# Patient Record
Sex: Male | Born: 1940 | Race: White | Hispanic: No | Smoking: Former smoker
Health system: Southern US, Community
[De-identification: ages and names within clinical notes are randomized; demographics above are authoritative.]

## PROBLEM LIST (undated history)

## (undated) DIAGNOSIS — E079 Disorder of thyroid, unspecified: Secondary | ICD-10-CM

## (undated) DIAGNOSIS — E785 Hyperlipidemia, unspecified: Secondary | ICD-10-CM

## (undated) DIAGNOSIS — I10 Essential (primary) hypertension: Secondary | ICD-10-CM

## (undated) DIAGNOSIS — J45909 Unspecified asthma, uncomplicated: Secondary | ICD-10-CM

## (undated) HISTORY — DX: Disorder of thyroid, unspecified: E07.9

## (undated) HISTORY — PX: THYROIDECTOMY: SHX17

## (undated) HISTORY — DX: Hyperlipidemia, unspecified: E78.5

## (undated) HISTORY — DX: Unspecified asthma, uncomplicated: J45.909

## (undated) HISTORY — DX: Essential (primary) hypertension: I10

---
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
## 2003-08-15 ENCOUNTER — Ambulatory Visit (HOSPITAL_COMMUNITY): Admission: RE | Admit: 2003-08-15 | Discharge: 2003-08-15 | Payer: Self-pay | Admitting: Internal Medicine

## 2008-04-13 ENCOUNTER — Encounter: Admission: RE | Admit: 2008-04-13 | Discharge: 2008-04-13 | Payer: Self-pay | Admitting: Family Medicine

## 2009-01-24 IMAGING — CT CT CHEST W/ CM
2 of 3 series · 15 of 36 positions shown, 18 images · IV contrast (75CC OMNI 300)
Comparison: No previous CT.

CLINICAL DATA: Abnormal chest x-ray.  Question pulmonary nodules

CT CHEST WITH CONTRAST
TECHNIQUE: Multidetector CT imaging of the chest was performed
following the standard protocol during bolus administration of
intravenous contrast.
Contrast: 75 ml 4mnipaque-MBB

[Series 3: routine chest · axial · 0.70mm/px · z∈[-310,+4]mm · 12 of 75 slices shown, 15 images]
[im 6/75  mediastinal]
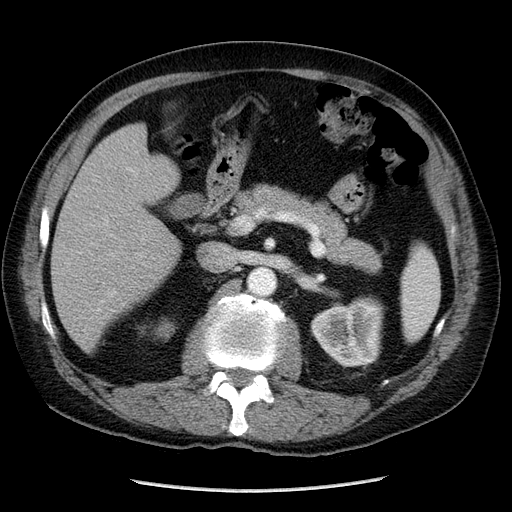
[im 6/75  lung]
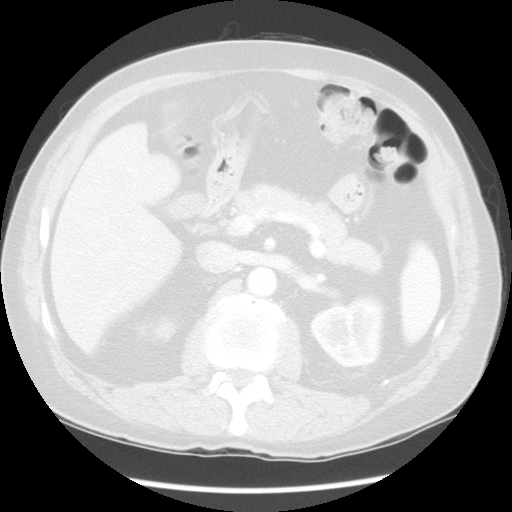
[im 11/75  lung]
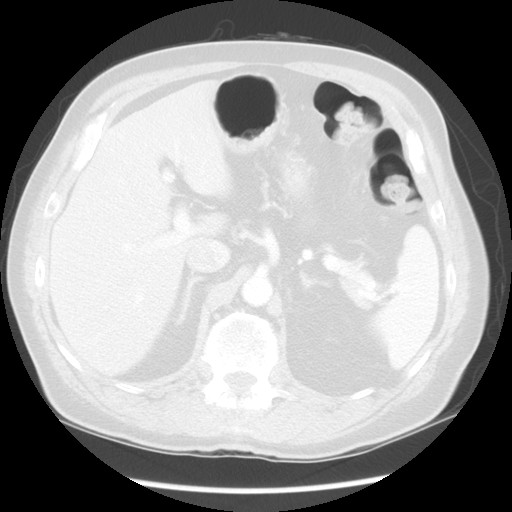
[im 17/75  lung]
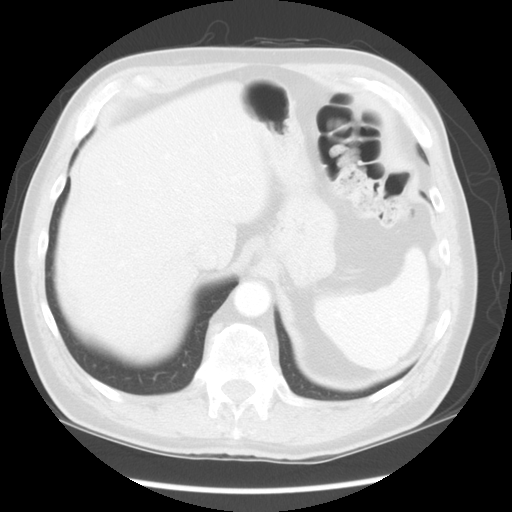
[im 22/75  lung]
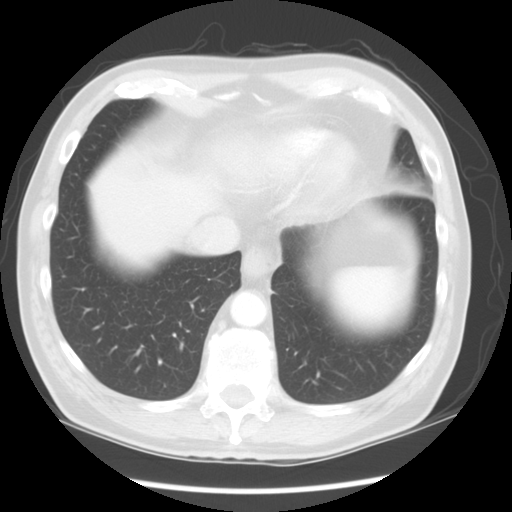
[im 28/75  mediastinal]
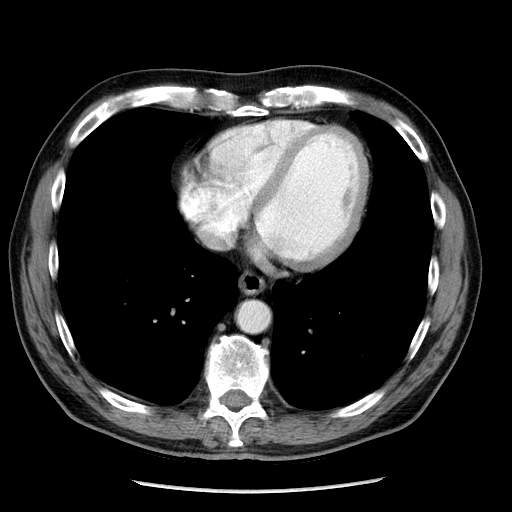
[im 28/75  lung]
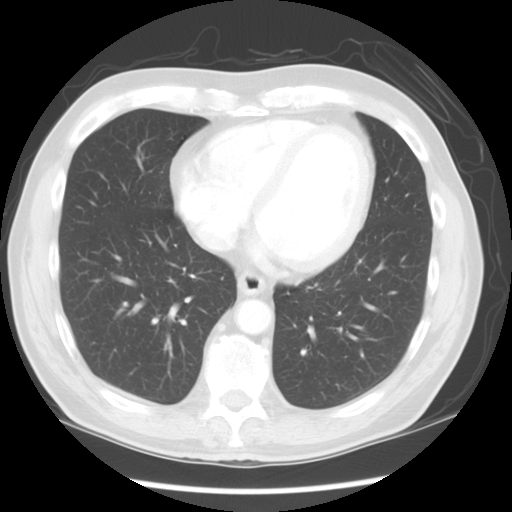
[im 33/75  lung]
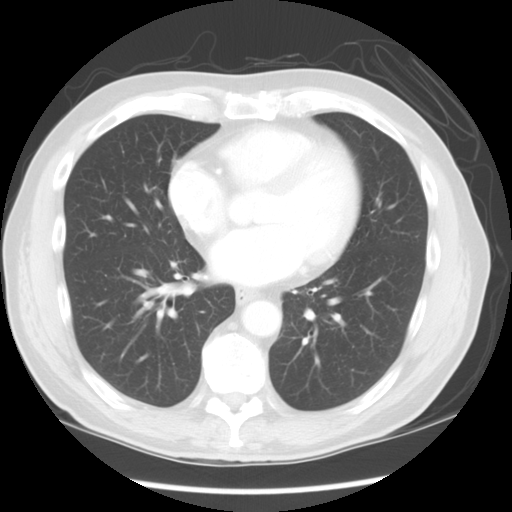
[im 42/75  lung]
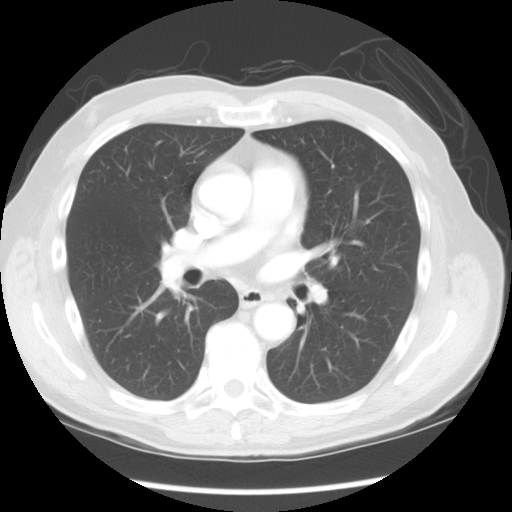
[im 47/75  lung]
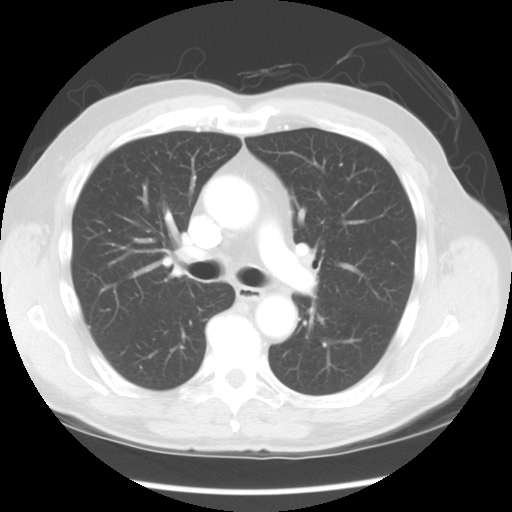
[im 53/75  mediastinal]
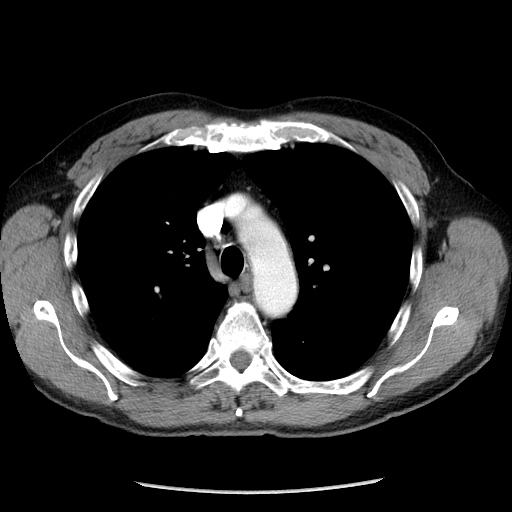
[im 53/75  lung]
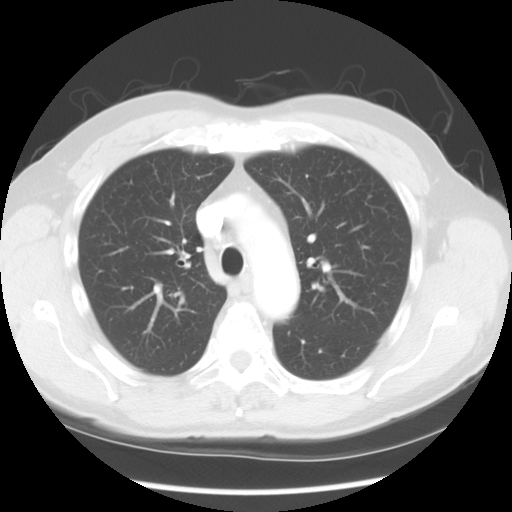
[im 58/75  lung]
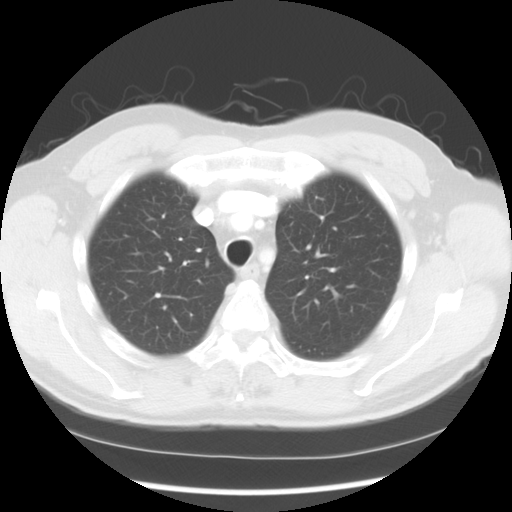
[im 64/75  lung]
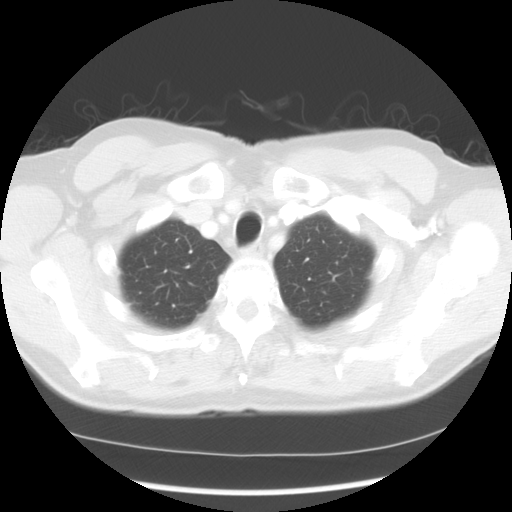
[im 69/75  lung]
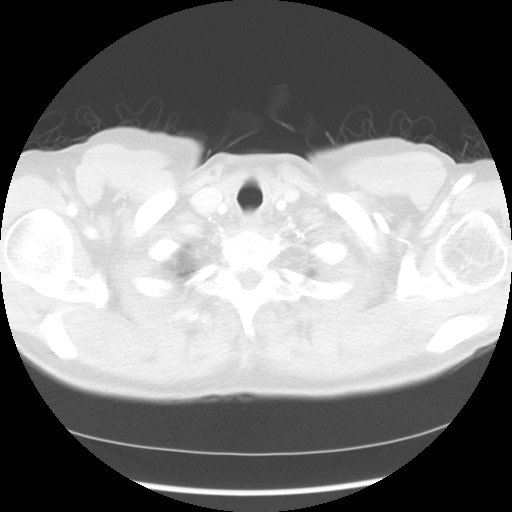

[Series 602: sagittal body · sagittal · 0.72mm/px · 3 of 145 slices shown]
[im 29/145  lung]
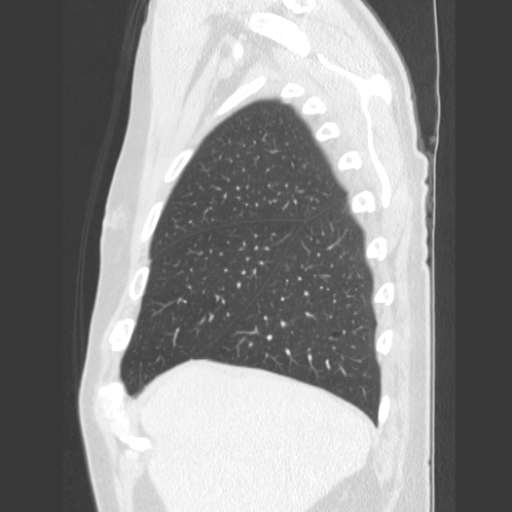
[im 58/145  lung]
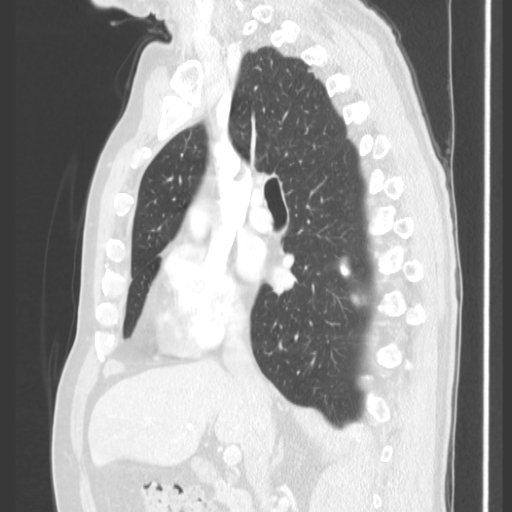
[im 87/145  lung]
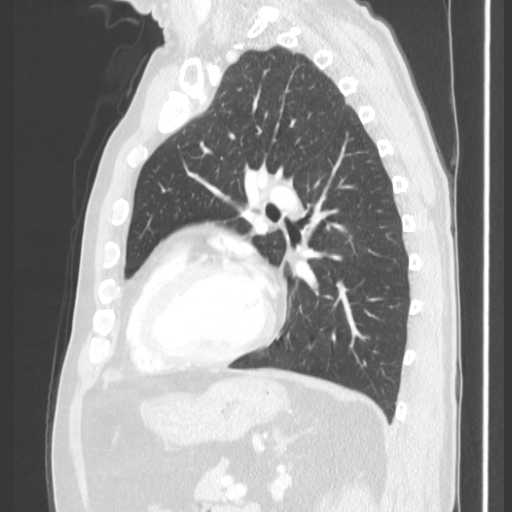

[15 of 36 positions shown; findings below may reference images not displayed]

Digitized films from [facility]
[REDACTED] are provided without a report.  The
lateral film has a circle drawn around a focal density superimposed
on the anterior aspect of a mid thoracic vertebral interspace,
suggesting spurring.
FINDINGS: Thyroid gland appears atrophic.  11 mm enhancing nodule
is seen in the expected location of the inferior right thyroid
gland and may represent a thyroid nodule.  There is no axillary or
mediastinal lymphadenopathy.  The heart size is normal.  No
pericardial or pleural effusion.

Lung windows show a calcified granuloma in the peripheral lingula.
No other parenchymal nodule or mass is evident.

The patient has a fairly prolific hypertrophic spurring from the
right anterolateral aspect of the thoracic vertebral bodies.  This
could account for the nodular opacities seen on the lateral chest x-
ray in question.
IMPRESSION: No abnormal pulmonary nodule at the area of concern circled on the
outside chest x-ray.  The findings on the chest x-ray are related
to spurring from the thoracic vertebral endplates anterolaterally.

11 mm enhancing nodule seen in the expected location of the thyroid
gland although the thyroid gland appears markedly atrophic.  I
cannot exclude that the patient has had a left thyroidectomy.
Given the round and avidly enhancing appearance of this nodule,
thyroid ultrasound is recommended to further evaluate and exclude
an underlying thyroid lesion on a background of thyroid atrophy.

## 2011-02-20 NOTE — Op Note (Signed)
NAME:  Samuel Norman, Samuel Norman                           ACCOUNT NO.:  [id number]   MEDICAL RECORD NO.:  [id number]                   PATIENT TYPE:  AMB   LOCATION:  DAY                                  FACILITY:  [facility]   PHYSICIAN:  R. Roetta Sessions, M.D.              DATE OF BIRTH:  1941/08/10   DATE OF PROCEDURE:  08/15/2003  DATE OF DISCHARGE:                                 OPERATIVE REPORT   PROCEDURE:  Screening colonoscopy.   INDICATIONS FOR PROCEDURE:  The patient is a 70 year old gentleman referred  by the courtesy of Dr. Gaynelle Cage for colorectal cancer screening.  He is  devoid of any lower GI tract symptoms.  No family history of colorectal  neoplasia.  He has never had his colon imaged.  Colonoscopy is now being  done as a standard screening maneuver.  This approach has been discussed  with the patient at length.  The potential risks, benefits, and alternatives  have been reviewed and questions answered.  He is agreeable.  Please see the  medical record for more information.   PROCEDURE:  O2 saturation, blood pressure, pulses, and respirations were  monitored throughout the entire procedure.  Conscious sedation was with  Versed 2 mg IV, Demerol 50 mg IV in divided doses.  The instrument used was  the Olympus video chip adult colonoscope.   FINDINGS:  Digital rectal examination revealed no abnormalities.   ENDOSCOPIC FINDINGS:  The prep was excellent.   Rectum:  Examination of the rectal mucosa including retroflex view of the  anal verge revealed no abnormalities.   Colon:  The colonic mucosa was surveyed from the rectosigmoid junction  through the left, transverse, right colon to the area of the appendiceal  orifice, ileocecal valve, and cecum.  It was a straight shot to the cecum.  The cecum was rapidly and easily reached.  The colonic mucosa to the cecum  appeared normal, except for some scattered sigmoid diverticula.  From the  level of the cecum and ileocecal valve, the  scope was slowly and cautiously  withdrawn.  All previously mentioned mucosal surfaces were again seen, and  again, no other abnormalities were observed.  The patient tolerated the  procedure well and was reactive in endoscopy.   IMPRESSION:  1. Normal rectum.  2. Scattered sigmoid diverticula.  3. The remainder of the colonic mucosa appeared normal.    RECOMMENDATIONS:  1. Diverticulosis literature provided to Mr. Franzel.  2. Repeat colonoscopy in 10 years.      ___________________________________________                                            Samuel Norman, M.D.   RMR/MEDQ  D:  08/15/2003  T:  08/15/2003  Job:  [id number]   cc:  Gaynelle Cage, MD  [phone]  [place]  [place] [address]  Fax: [phone]

## 2012-08-09 LAB — COMPLETE METABOLIC PANEL WITH GFR
PSA: 1.11
TSH: 2.975

## 2013-08-17 ENCOUNTER — Ambulatory Visit: Payer: Self-pay | Admitting: Family Medicine

## 2013-08-25 ENCOUNTER — Other Ambulatory Visit: Payer: Self-pay | Admitting: Physician Assistant

## 2013-08-28 MED ORDER — LEVOTHYROXINE SODIUM 100 MCG PO TABS: 100.0000 ug | ORAL_TABLET | Freq: Every day | ORAL | Status: DC

## 2013-08-28 NOTE — Telephone Encounter (Signed)
hasnt been seen since 08/09/12

## 2013-09-05 ENCOUNTER — Encounter: Payer: Self-pay | Admitting: Nurse Practitioner

## 2013-09-05 ENCOUNTER — Other Ambulatory Visit: Payer: Self-pay | Admitting: Nurse Practitioner

## 2013-09-05 ENCOUNTER — Ambulatory Visit (INDEPENDENT_AMBULATORY_CARE_PROVIDER_SITE_OTHER): Payer: Medicare Other | Admitting: Nurse Practitioner

## 2013-09-05 VITALS — BP 150/80 | HR 56 | Temp 98.0°F | Ht 69.18 in | Wt 169.8 lb

## 2013-09-05 DIAGNOSIS — E785 Hyperlipidemia, unspecified: Secondary | ICD-10-CM | POA: Insufficient documentation

## 2013-09-05 DIAGNOSIS — Z0000 Encounter for general adult medical examination without abnormal findings: Secondary | ICD-10-CM

## 2013-09-05 DIAGNOSIS — E039 Hypothyroidism, unspecified: Secondary | ICD-10-CM

## 2013-09-05 DIAGNOSIS — I10 Essential (primary) hypertension: Secondary | ICD-10-CM | POA: Insufficient documentation

## 2013-09-05 LAB — CBC
HCT: 44 % (ref 39.0–52.0)
MCH: 30.1 pg (ref 26.0–34.0)
MCHC: 34.1 g/dL (ref 30.0–36.0)
MCV: 88.4 fL (ref 78.0–100.0)
RDW: 13.5 % (ref 11.5–15.5)

## 2013-09-05 LAB — HEMOGLOBIN A1C: Mean Plasma Glucose: 108 mg/dL (ref <117)

## 2013-09-05 LAB — RENAL FUNCTION PANEL
Albumin: 4.5 g/dL (ref 3.5–5.2)
CO2: 28 mEq/L (ref 19–32)
Chloride: 105 mEq/L (ref 96–112)
Sodium: 141 mEq/L (ref 135–145)

## 2013-09-05 LAB — HEPATIC FUNCTION PANEL
AST: 16 U/L (ref 0–37)
Alkaline Phosphatase: 67 U/L (ref 39–117)
Bilirubin, Direct: 0.1 mg/dL (ref 0.0–0.3)
Total Bilirubin: 0.7 mg/dL (ref 0.3–1.2)

## 2013-09-05 LAB — LIPID PANEL: LDL Cholesterol: 143 mg/dL — ABNORMAL HIGH (ref 0–99)

## 2013-09-05 LAB — TSH: TSH: 2.91 u[IU]/mL (ref 0.350–4.500)

## 2013-09-05 MED ORDER — LEVOTHYROXINE SODIUM 100 MCG PO TABS: 100.0000 ug | ORAL_TABLET | Freq: Every day | ORAL | Status: DC

## 2013-09-05 NOTE — Progress Notes (Signed)
Pre-visit discussion using our clinic review tool. No additional management support is needed unless otherwise documented below in the visit note.  

## 2013-09-05 NOTE — Patient Instructions (Signed)
Please get labs done. Our office will call you results. Continue meds as prescribed. If you continue to have neck tightness and/or arm numbness with exertion, please consider seeing cardiology again. I recommend an 81 mg aspirin daily for stroke prevention. Continue your good health habits, like exercise. Great to see you!  Preventive Care for Adults, Male A healthy lifestyle and preventive care can promote health and wellness. Preventive health guidelines for men include the following key practices:  A routine yearly physical is a good way to check with your caregiver about your health and preventative screening. It is a chance to share any concerns and updates on your health, and to receive a thorough exam.  Visit your dentist for a routine exam and preventative care every 6 months. Brush your teeth twice a day and floss once a day. Good oral hygiene prevents tooth decay and gum disease.  The frequency of eye exams is based on your age, health, family medical history, use of contact lenses, and other factors. Follow your caregiver's recommendations for frequency of eye exams.  Eat a healthy diet. Foods like vegetables, fruits, whole grains, low-fat dairy products, and lean protein foods contain the nutrients you need without too many calories. Decrease your intake of foods high in solid fats, added sugars, and salt. Eat the right amount of calories for you.Get information about a proper diet from your caregiver, if necessary.  Regular physical exercise is one of the most important things you can do for your health. Most adults should get at least 150 minutes of moderate-intensity exercise (any activity that increases your heart rate and causes you to sweat) each week. In addition, most adults need muscle-strengthening exercises on 2 or more days a week.  Maintain a healthy weight. The body mass index (BMI) is a screening tool to identify possible weight problems. It provides an estimate of body fat  based on height and weight. Your caregiver can help determine your BMI, and can help you achieve or maintain a healthy weight.For adults 20 years and older:  A BMI below 18.5 is considered underweight.  A BMI of 18.5 to 24.9 is normal.  A BMI of 25 to 29.9 is considered overweight.  A BMI of 30 and above is considered obese.  Maintain normal blood lipids and cholesterol levels by exercising and minimizing your intake of saturated fat. Eat a balanced diet with plenty of fruit and vegetables. Blood tests for lipids and cholesterol should begin at age 17 and be repeated every 5 years. If your lipid or cholesterol levels are high, you are over 50, or you are a high risk for heart disease, you may need your cholesterol levels checked more frequently.Ongoing high lipid and cholesterol levels should be treated with medicines if diet and exercise are not effective.  If you smoke, find out from your caregiver how to quit. If you do not use tobacco, do not start.  Lung cancer screening is recommended for adults aged 45 80 years who are at high risk for developing lung cancer because of a history of smoking. Yearly low-dose computed tomography (CT) is recommended for people who have at least a 30-pack-year history of smoking and are a current smoker or have quit within the past 15 years. A pack year of smoking is smoking an average of 1 pack of cigarettes a day for 1 year (for example: 1 pack a day for 30 years or 2 packs a day for 15 years). Yearly screening should continue until the  smoker has stopped smoking for at least 15 years. Yearly screening should also be stopped for people who develop a health problem that would prevent them from having lung cancer treatment.  If you choose to drink alcohol, do not exceed 2 drinks per day. One drink is considered to be 12 ounces (355 mL) of beer, 5 ounces (148 mL) of wine, or 1.5 ounces (44 mL) of liquor.  Avoid use of street drugs. Do not share needles with  anyone. Ask for help if you need support or instructions about stopping the use of drugs.  High blood pressure causes heart disease and increases the risk of stroke. Your blood pressure should be checked at least every 1 to 2 years. Ongoing high blood pressure should be treated with medicines, if weight loss and exercise are not effective.  If you are 85 to 72 years old, ask your caregiver if you should take aspirin to prevent heart disease.  Diabetes screening involves taking a blood sample to check your fasting blood sugar level. This should be done once every 3 years, after age 57, if you are within normal weight and without risk factors for diabetes. Testing should be considered at a younger age or be carried out more frequently if you are overweight and have at least 1 risk factor for diabetes.  Colorectal cancer can be detected and often prevented. Most routine colorectal cancer screening begins at the age of 15 and continues through age 40. However, your caregiver may recommend screening at an earlier age if you have risk factors for colon cancer. On a yearly basis, your caregiver may provide home test kits to check for hidden blood in the stool. Use of a small camera at the end of a tube, to directly examine the colon (sigmoidoscopy or colonoscopy), can detect the earliest forms of colorectal cancer. Talk to your caregiver about this at age 24, when routine screening begins. Direct examination of the colon should be repeated every 5 to 10 years through age 30, unless early forms of pre-cancerous polyps or small growths are found.  Hepatitis C blood testing is recommended for all people born from [birth year] through 1965 and any individual with known risks for hepatitis C.  Practice safe sex. Use condoms and avoid high-risk sexual practices to reduce the spread of sexually transmitted infections (STIs). STIs include gonorrhea, chlamydia, syphilis, trichomonas, herpes, HPV, and human immunodeficiency  virus (HIV). Herpes, HIV, and HPV are viral illnesses that have no cure. They can result in disability, cancer, and death.  A one-time screening for abdominal aortic aneurysm (AAA) and surgical repair of large AAAs by sound wave imaging (ultrasonography) is recommended for ages 19 to 72 years who are current or former smokers.  Healthy men should no longer receive prostate-specific antigen (PSA) blood tests as part of routine cancer screening. Consult with your caregiver about prostate cancer screening.  Testicular cancer screening is not recommended for adult males who have no symptoms. Screening includes self-exam, caregiver exam, and other screening tests. Consult with your caregiver about any symptoms you have or any concerns you have about testicular cancer.  Use sunscreen. Apply sunscreen liberally and repeatedly throughout the day. You should seek shade when your shadow is shorter than you. Protect yourself by wearing long sleeves, pants, a wide-brimmed hat, and sunglasses year round, whenever you are outdoors.  Once a month, do a whole body skin exam, using a mirror to look at the skin on your back. Notify your caregiver of new moles,  moles that have irregular borders, moles that are larger than a pencil eraser, or moles that have changed in shape or color.  Stay current with required immunizations.  Influenza vaccine. All adults should be immunized every year.  Tetanus, diphtheria, and acellular pertussis (Td, Tdap) vaccine. An adult who has not previously received Tdap or who does not know his vaccine status should receive 1 dose of Tdap. This initial dose should be followed by tetanus and diphtheria toxoids (Td) booster doses every 10 years. Adults with an unknown or incomplete history of completing a 3-dose immunization series with Td-containing vaccines should begin or complete a primary immunization series including a Tdap dose. Adults should receive a Td booster every 10  years.  Varicella vaccine. An adult without evidence of immunity to varicella should receive 2 doses or a second dose if he has previously received 1 dose.  Human papillomavirus (HPV) vaccine. Males aged 46 21 years who have not received the vaccine previously should receive the 3-dose series. Males aged 13 26 years may be immunized. Immunization is recommended through the age of 39 years for any male who has sex with males and did not get any or all doses earlier. Immunization is recommended for any person with an immunocompromised condition through the age of 26 years if he did not get any or all doses earlier. During the 3-dose series, the second dose should be obtained 4 8 weeks after the first dose. The third dose should be obtained 24 weeks after the first dose and 16 weeks after the second dose.  Zoster vaccine. One dose is recommended for adults aged 65 years or older unless certain conditions are present.  Measles, mumps, and rubella (MMR) vaccine. Adults born before 65 generally are considered immune to measles and mumps. Adults born in [birth year] or later should have 1 or more doses of MMR vaccine unless there is a contraindication to the vaccine or there is laboratory evidence of immunity to each of the three diseases. A routine second dose of MMR vaccine should be obtained at least 28 days after the first dose for students attending postsecondary schools, health care workers, or international travelers. People who received inactivated measles vaccine or an unknown type of measles vaccine during 1963 1967 should receive 2 doses of MMR vaccine. People who received inactivated mumps vaccine or an unknown type of mumps vaccine before 1979 and are at high risk for mumps infection should consider immunization with 2 doses of MMR vaccine. Unvaccinated health care workers born before 45 who lack laboratory evidence of measles, mumps, or rubella immunity or laboratory confirmation of disease should  consider measles and mumps immunization with 2 doses of MMR vaccine or rubella immunization with 1 dose of MMR vaccine.  Pneumococcal 13-valent conjugate (PCV13) vaccine. When indicated, a person who is uncertain of his immunization history and has no record of immunization should receive the PCV13 vaccine. An adult aged 61 years or older who has certain medical conditions and has not been previously immunized should receive 1 dose of PCV13 vaccine. This PCV13 should be followed with a dose of pneumococcal polysaccharide (PPSV23) vaccine. The PPSV23 vaccine dose should be obtained at least 8 weeks after the dose of PCV13 vaccine. An adult aged 18 years or older who has certain medical conditions and previously received 1 or more doses of PPSV23 vaccine should receive 1 dose of PCV13. The PCV13 vaccine dose should be obtained 1 or more years after the last PPSV23 vaccine dose.  Pneumococcal  polysaccharide (PPSV23) vaccine. When PCV13 is also indicated, PCV13 should be obtained first. All adults aged 81 years and older should be immunized. An adult younger than age 57 years who has certain medical conditions should be immunized. Any person who resides in a nursing home or long-term care facility should be immunized. An adult smoker should be immunized. People with an immunocompromised condition and certain other conditions should receive both PCV13 and PPSV23 vaccines. People with human immunodeficiency virus (HIV) infection should be immunized as soon as possible after diagnosis. Immunization during chemotherapy or radiation therapy should be avoided. Routine use of PPSV23 vaccine is not recommended for American Indians, [address], or people younger than 65 years unless there are medical conditions that require PPSV23 vaccine. When indicated, people who have unknown immunization and have no record of immunization should receive PPSV23 vaccine. One-time revaccination 5 years after the first dose of PPSV23 is  recommended for people aged 24 64 years who have chronic kidney failure, nephrotic syndrome, asplenia, or immunocompromised conditions. People who received 1 2 doses of PPSV23 before age 31 years should receive another dose of PPSV23 vaccine at age 72 years or later if at least 5 years have passed since the previous dose. Doses of PPSV23 are not needed for people immunized with PPSV23 at or after age 75 years.  Meningococcal vaccine. Adults with asplenia or persistent complement component deficiencies should receive 2 doses of quadrivalent meningococcal conjugate (MenACWY-D) vaccine. The doses should be obtained at least 2 months apart. Microbiologists working with certain meningococcal bacteria, military recruits, people at risk during an outbreak, and people who travel to or live in countries with a high rate of meningitis should be immunized. A first-year college student up through age 81 years who is living in a residence hall should receive a dose if he did not receive a dose on or after his 16th birthday. Adults who have certain high-risk conditions should receive one or more doses of vaccine.  Hepatitis A vaccine. Adults who wish to be protected from this disease, have certain high-risk conditions, work with hepatitis A-infected animals, work in hepatitis A research labs, or travel to or work in countries with a high rate of hepatitis A should be immunized. Adults who were previously unvaccinated and who anticipate close contact with an international adoptee during the first 60 days after arrival in the [place] from a country with a high rate of hepatitis A should be immunized.  Hepatitis B vaccine. Adults who wish to be protected from this disease, have certain high-risk conditions, may be exposed to blood or other infectious body fluids, are household contacts or sex partners of hepatitis B positive people, are clients or workers in certain care facilities, or travel to or work in countries with  a high rate of hepatitis B should be immunized.  Haemophilus influenzae type b (Hib) vaccine. A previously unvaccinated person with asplenia or sickle cell disease or having a scheduled splenectomy should receive 1 dose of Hib vaccine. Regardless of previous immunization, a recipient of a hematopoietic stem cell transplant should receive a 3-dose series 6 12 months after his successful transplant. Hib vaccine is not recommended for adults with HIV infection. Preventive Service / Frequency Ages 60 to 29  Blood pressure check.** / Every 1 to 2 years.  Lipid and cholesterol check.** / Every 5 years beginning at age 54.  Hepatitis C blood test.** / For any individual with known risks for hepatitis C.  Skin self-exam. / Monthly.  Influenza vaccine. / Every year.  Tetanus, diphtheria, and acellular pertussis (Tdap, Td) vaccine.** / Consult your caregiver. 1 dose of Td every 10 years.  Varicella vaccine.** / Consult your caregiver.  HPV vaccine. / 3 doses over 6 months, if 26 and younger.  Measles, mumps, rubella (MMR) vaccine.** / You need at least 1 dose of MMR if you were born in 1957 or later. You may also need a 2nd dose.  Pneumococcal 13-valent conjugate (PCV13) vaccine.** / Consult your caregiver.  Pneumococcal polysaccharide (PPSV23) vaccine.** / 1 to 2 doses if you smoke cigarettes or if you have certain conditions.  Meningococcal vaccine.** / 1 dose if you are age 30 to 45 years and a Orthoptist living in a residence hall, or have one of several medical conditions, you need to get vaccinated against meningococcal disease. You may also need additional booster doses.  Hepatitis A vaccine.** / Consult your caregiver.  Hepatitis B vaccine.** / Consult your caregiver.  Haemophilus influenzae type b (Hib) vaccine.** / Consult your caregiver. Ages 39 to 46  Blood pressure check.** / Every 1 to 2 years.  Lipid and cholesterol check.** / Every 5 years beginning at age  47.  Lung cancer screening. / Every year if you are aged 73 80 years and have a 30-pack-year history of smoking and currently smoke or have quit within the past 15 years. Yearly screening is stopped once you have quit smoking for at least 15 years or develop a health problem that would prevent you from having lung cancer treatment.  Fecal occult blood test (FOBT) of stool. / Every year beginning at age 35 and continuing until age 35. You may not have to do this test if you get colonoscopy every 10 years.  Flexible sigmoidoscopy** or colonoscopy.** / Every 5 years for a flexible sigmoidoscopy or every 10 years for a colonoscopy beginning at age 14 and continuing until age 54.  Hepatitis C blood test.** / For all people born from [birth year] through 1965 and any individual with known risks for hepatitis C.  Skin self-exam. / Monthly.  Influenza vaccine. / Every year.  Tetanus, diphtheria, and acellular pertussis (Tdap/Td) vaccine.** / Consult your caregiver. 1 dose of Td every 10 years.  Varicella vaccine.** / Consult your caregiver.  Zoster vaccine.** / 1 dose for adults aged 9 years or older.  Measles, mumps, rubella (MMR) vaccine.** / You need at least 1 dose of MMR if you were born in 1957 or later. You may also need a 2nd dose.  Pneumococcal 13-valent conjugate (PCV13) vaccine.** / Consult your caregiver.  Pneumococcal polysaccharide (PPSV23) vaccine.** / 1 to 2 doses if you smoke cigarettes or if you have certain conditions.  Meningococcal vaccine.** / Consult your caregiver.  Hepatitis A vaccine.** / Consult your caregiver.  Hepatitis B vaccine.** / Consult your caregiver.  Haemophilus influenzae type b (Hib) vaccine.** / Consult your caregiver. Ages 70 and over  Blood pressure check.** / Every 1 to 2 years.  Lipid and cholesterol check.**/ Every 5 years beginning at age 41.  Lung cancer screening. / Every year if you are aged 57 80 years and have a 30-pack-year history of  smoking and currently smoke or have quit within the past 15 years. Yearly screening is stopped once you have quit smoking for at least 15 years or develop a health problem that would prevent you from having lung cancer treatment.  Fecal occult blood test (FOBT) of stool. / Every year beginning at age 61 and  continuing until age 90. You may not have to do this test if you get colonoscopy every 10 years.  Flexible sigmoidoscopy** or colonoscopy.** / Every 5 years for a flexible sigmoidoscopy or every 10 years for a colonoscopy beginning at age 53 and continuing until age 17.  Hepatitis C blood test.** / For all people born from [birth year] through 1966 and any individual with known risks for hepatitis C.  Abdominal aortic aneurysm (AAA) screening.** / A one-time screening for ages 31 to 98 years who are current or former smokers.  Skin self-exam. / Monthly.  Influenza vaccine. / Every year.  Tetanus, diphtheria, and acellular pertussis (Tdap/Td) vaccine.** / 1 dose of Td every 10 years.  Varicella vaccine.** / Consult your caregiver.  Zoster vaccine.** / 1 dose for adults aged 31 years or older.  Pneumococcal 13-valent conjugate (PCV13) vaccine.** / Consult your caregiver.  Pneumococcal polysaccharide (PPSV23) vaccine.** / 1 dose for all adults aged 26 years and older.  Meningococcal vaccine.** / Consult your caregiver.  Hepatitis A vaccine.** / Consult your caregiver.  Hepatitis B vaccine.** / Consult your caregiver.  Haemophilus influenzae type b (Hib) vaccine.** / Consult your caregiver. **Family history and personal history of risk and conditions may change your caregiver's recommendations. Document Released: 11/17/2001 Document Revised: 01/16/2013 Document Reviewed: 02/16/2011 [facility] Patient Information 2014 [place], [place].  Hypertension As your heart beats, it forces blood through your arteries. This force is your blood pressure. If the pressure is too high, it is called  hypertension (HTN) or high blood pressure. HTN is dangerous because you may have it and not know it. High blood pressure may mean that your heart has to work harder to pump blood. Your arteries may be narrow or stiff. The extra work puts you at risk for heart disease, stroke, and other problems.  Blood pressure consists of two numbers, a higher number over a lower, 110/72, for example. It is stated as "110 over 72." The ideal is below 120 for the top number (systolic) and under 80 for the bottom (diastolic). Write down your blood pressure today. You should pay close attention to your blood pressure if you have certain conditions such as:  Heart failure.  Prior heart attack.  Diabetes  Chronic kidney disease.  Prior stroke.  Multiple risk factors for heart disease. To see if you have HTN, your blood pressure should be measured while you are seated with your arm held at the level of the heart. It should be measured at least twice. A one-time elevated blood pressure reading (especially in the Emergency Department) does not mean that you need treatment. There may be conditions in which the blood pressure is different between your right and left arms. It is important to see your caregiver soon for a recheck. Most people have essential hypertension which means that there is not a specific cause. This type of high blood pressure may be lowered by changing lifestyle factors such as:  Stress.  Smoking.  Lack of exercise.  Excessive weight.  Drug/tobacco/alcohol use.  Eating less salt. Most people do not have symptoms from high blood pressure until it has caused damage to the body. Effective treatment can often prevent, delay or reduce that damage. TREATMENT  When a cause has been identified, treatment for high blood pressure is directed at the cause. There are a large number of medications to treat HTN. These fall into several categories, and your caregiver will help you select the medicines  that are best for you. Medications may  have side effects. You should review side effects with your caregiver. If your blood pressure stays high after you have made lifestyle changes or started on medicines,   Your medication(s) may need to be changed.  Other problems may need to be addressed.  Be certain you understand your prescriptions, and know how and when to take your medicine.  Be sure to follow up with your caregiver within the time frame advised (usually within two weeks) to have your blood pressure rechecked and to review your medications.  If you are taking more than one medicine to lower your blood pressure, make sure you know how and at what times they should be taken. Taking two medicines at the same time can result in blood pressure that is too low. SEEK IMMEDIATE MEDICAL CARE IF:  You develop a severe headache, blurred or changing vision, or confusion.  You have unusual weakness or numbness, or a faint feeling.  You have severe chest or abdominal pain, vomiting, or breathing problems. MAKE SURE YOU:   Understand these instructions.  Will watch your condition.  Will get help right away if you are not doing well or get worse. Document Released: 09/21/2005 Document Revised: 12/14/2011 Document Reviewed: 05/11/2008 [facility] Patient Information 2014 [place], [place].

## 2013-09-05 NOTE — Progress Notes (Signed)
Subjective:     Samuel Norman is a 72 y.o. male and is here to establish care. He is currently treated for HTN, dyslipidemia, and thyroid disease. Pt does not take all meds every day-he alternates days. He is changing providers due to convenience of location. He mentions that he has some neck tightness & R arm numbness when he is jogging up hill. He had a stress test & cardiac cath a few years ago that did not indicate he has heart disease. We discussed his risk factors: age, gender, HTN, & dyslipidemia.  History   Social History  . Marital Status: Married    Spouse Name: Renee    Number of Children: 2  . Years of Education: N/A   Occupational History  . retired     [employer], broadcasting/film/video   Social History Main Topics  . Smoking status: Former Smoker    Types: Cigarettes    Quit date: 09/06/1983  . Smokeless tobacco: Never Used  . Alcohol Use: No  . Drug Use: No  . Sexual Activity: Not on file   Other Topics Concern  . Not on file   Social History Narrative   Samuel Norman lives in [place] with his wife & son-in-law. He is a retired Engineer, site.   Health Maintenance  Topic Date Due  . Colonoscopy  09/05/2013  . Influenza Vaccine  09/05/2014  . Pneumococcal Polysaccharide Vaccine Age 41 And Over  09/05/2014  . Zostavax  09/05/2014  . Tetanus/tdap  09/05/2014  . Colon Cancer Screening Annual Fobt  09/05/2014    The following portions of the patient's history were reviewed and updated as appropriate: allergies, current medications, past family history, past medical history, past social history, past surgical history and problem list.  Review of Systems Constitutional: negative for chills, fatigue, fevers, night sweats and weight loss Eyes: positive for contacts/glasses Ears, nose, mouth, throat, and face: positive for partial plate Respiratory: negative for cough, sputum and wheezing Cardiovascular: positive for exertional chest pressure/discomfort, negative for irregular heart beat,  lower extremity edema and near-syncope Genitourinary:negative for decreased stream and urinary incontinence Integument/breast: positive for treated for eczema in past Behavioral/Psych: negative for excessive alcohol consumption, illegal drug usage, sleep disturbance and tobacco use Endocrine: negative for temperature intolerance Allergic/Immunologic: negative for hay fever   Objective:    BP 150/80  Pulse 56  Temp(Src) 98 F (36.7 C) (Oral)  Ht 5' 9.18" (1.757 m)  Wt 169 lb 12 oz (76.998 kg)  BMI 24.94 kg/m2  SpO2 99% General appearance: alert, cooperative, appears stated age and no distress Head: Normocephalic, without obvious abnormality, atraumatic Eyes: negative findings: lids and lashes normal, conjunctivae and sclerae normal, corneas clear and pupils equal, round, reactive to light and accomodation, positive findings: wears glasses Ears: normal TM's and external ear canals both ears Throat: lips, mucosa, and tongue normal; teeth and gums normal and upper & lower partial Lungs: clear to auscultation bilaterally Heart: reg rhythm, bradycardia Extremities: extremities normal, atraumatic, no cyanosis or edema and no edema, redness or tenderness in the calves or thighs Pulses: 2+ and symmetric Skin: brown seborrheic keratosis, some color variation, pt states has not changed in years. L side neck Few bruises L forearm-pt states he hit tree while clearing lot Lymph nodes: Cervical, supraclavicular, and axillary nodes normal. Neurologic: Alert and oriented X 3, normal strength and tone. Normal symmetric reflexes. 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 Denies symptoms of temp intolerance, bowel changes, heart palpitations. 4 seborrheic keratosis L side neck 5  neck tightness & r arm numbness w/exertion, prev cardiac w/ nml per pt 6 Prev care due for colo scr, tdap, pneumococcal, flu, shingles     Plan:    1 continue current dose to keep BP under 150/90. Ren & hep func, urine microalb today 2 Continue meds. Labs today 3 cont meds, labs today 4 monitor for changes 5 rec cardiac ref, pt declined, recmd scale back exercise intensity. Discussed risk factors for MI, CVD, stroke. recmd ASA daily. 6 declined all vaccines-does not want exposure to mercury. Advised no mercury in preservative free flu & boostrix. Pt will consider. Gave reading materials. ifob today. F/u 1 year or sooner if needed. See After Visit Summary for Counseling Recommendations

## 2013-09-06 LAB — VITAMIN D 25 HYDROXY (VIT D DEFICIENCY, FRACTURES): Vit D, 25-Hydroxy: 40 ng/mL (ref 30–89)

## 2013-09-07 ENCOUNTER — Telehealth: Payer: Self-pay | Admitting: Nurse Practitioner

## 2013-09-07 NOTE — Telephone Encounter (Signed)
No change in TSH. Ren & hep func nml. No diabetes. Blood counts nml. Chol fair on statin. Adv daily exercise, cut out sugar. F/u 1 yr.

## 2013-09-12 ENCOUNTER — Other Ambulatory Visit: Payer: Self-pay | Admitting: *Deleted

## 2013-09-12 DIAGNOSIS — E039 Hypothyroidism, unspecified: Secondary | ICD-10-CM

## 2013-09-12 MED ORDER — LEVOTHYROXINE SODIUM 100 MCG PO TABS: 100.0000 ug | ORAL_TABLET | Freq: Every day | ORAL | Status: DC

## 2013-09-12 NOTE — Telephone Encounter (Signed)
Resent rx

## 2013-09-18 ENCOUNTER — Other Ambulatory Visit: Payer: Self-pay | Admitting: Nurse Practitioner

## 2013-09-19 LAB — FECAL OCCULT BLOOD, IMMUNOCHEMICAL: Fecal Occult Blood: NEGATIVE

## 2013-10-10 ENCOUNTER — Ambulatory Visit (INDEPENDENT_AMBULATORY_CARE_PROVIDER_SITE_OTHER): Payer: Medicare PPO | Admitting: Licensed Clinical Social Worker

## 2013-10-10 DIAGNOSIS — F4320 Adjustment disorder, unspecified: Secondary | ICD-10-CM

## 2013-10-25 ENCOUNTER — Encounter: Payer: Self-pay | Admitting: Nurse Practitioner

## 2013-12-21 ENCOUNTER — Telehealth: Payer: Self-pay

## 2013-12-21 MED ORDER — LISINOPRIL-HYDROCHLOROTHIAZIDE 10-12.5 MG PO TABS: 1.0000 | ORAL_TABLET | Freq: Every day | ORAL | Status: DC

## 2013-12-21 MED ORDER — SIMVASTATIN 10 MG PO TABS: 10.0000 mg | ORAL_TABLET | Freq: Every day | ORAL | Status: DC

## 2013-12-21 NOTE — Telephone Encounter (Signed)
Pt called requesting refills on Simvastatin 10 mg and Lisinopril/HCTZ 10-12.5. [facility] [address]

## 2014-04-03 ENCOUNTER — Ambulatory Visit (INDEPENDENT_AMBULATORY_CARE_PROVIDER_SITE_OTHER): Payer: Medicare PPO | Admitting: Nurse Practitioner

## 2014-04-03 ENCOUNTER — Encounter: Payer: Self-pay | Admitting: Nurse Practitioner

## 2014-04-03 VITALS — BP 137/82 | HR 89 | Temp 97.4°F | Resp 18 | Ht 69.0 in | Wt 171.0 lb

## 2014-04-03 DIAGNOSIS — L570 Actinic keratosis: Secondary | ICD-10-CM | POA: Insufficient documentation

## 2014-04-03 DIAGNOSIS — J069 Acute upper respiratory infection, unspecified: Secondary | ICD-10-CM

## 2014-04-03 DIAGNOSIS — B9789 Other viral agents as the cause of diseases classified elsewhere: Secondary | ICD-10-CM

## 2014-04-03 DIAGNOSIS — L851 Acquired keratosis [keratoderma] palmaris et plantaris: Secondary | ICD-10-CM

## 2014-04-03 NOTE — Patient Instructions (Signed)
Please see dermatology for removal of keratotic lesion. For cough: I think it is viral & will likely resolve in 2 more weeks. Honey has been proven to reduce cough: thin with lemon or tea. Please let me know if cough gets worse, you develop fever, persistent fatigue, or chest pain with inspiration. For fluid in ear, use mechanical method several times daily as discussed in office.

## 2014-04-03 NOTE — Progress Notes (Signed)
   Subjective:    Patient ID: Samuel Norman, male    DOB: Mar 02, 1941, 73 y.o.   MRN: [id number]  Cough This is a new problem. The current episode started 1 to 4 weeks ago (2 wks). The problem has been gradually improving. The problem occurs hourly. The cough is non-productive. Associated symptoms include ear congestion, a fever (resolved) and nasal congestion (mild). Pertinent negatives include no chest pain, chills (resolved), ear pain, headaches, myalgias, postnasal drip, sore throat, shortness of breath, sweats, weight loss or wheezing. Associated symptoms comments: Fatigue . The symptoms are aggravated by lying down. Treatments tried: saline nasal rinse. The treatment provided mild relief. There is no history of asthma, COPD or emphysema.      Review of Systems  Constitutional: Positive for fever (resolved). Negative for chills (resolved) and weight loss.  HENT: Positive for congestion and voice change (hoarse). Negative for ear pain, postnasal drip and sore throat.   Respiratory: Positive for cough. Negative for chest tightness, shortness of breath and wheezing.   Cardiovascular: Negative for chest pain and palpitations.  Musculoskeletal: Negative for myalgias.  Neurological: Negative for headaches.       Objective:   Physical Exam  Vitals reviewed. Constitutional: He is oriented to person, place, and time. He appears well-developed and well-nourished. No distress.  HENT:  Head: Normocephalic and atraumatic.    Right Ear: External ear normal.  Left Ear: External ear normal.  Mouth/Throat: Oropharynx is clear and moist. No oropharyngeal exudate.  Clear fluid noted LTM, bones visible, mild retraction of TM.  Eyes: Conjunctivae are normal. Right eye exhibits no discharge. Left eye exhibits no discharge.  Neck: Normal range of motion. Neck supple. No thyromegaly present.  Cardiovascular: Normal rate, regular rhythm and normal heart sounds.   No murmur heard. Pulmonary/Chest:  Breath sounds normal. No respiratory distress. He has no wheezes. He has no rales.  Lymphadenopathy:    He has no cervical adenopathy.  Neurological: He is alert and oriented to person, place, and time.  Skin: Skin is warm and dry.  Lesion L nare-keratotic.  Psychiatric: He has a normal mood and affect. His behavior is normal. Thought content normal.          Assessment & Plan:  1. Keratotic lesion L nare - Ambulatory referral to Dermatology  2. Viral URI with cough Otitis media w/ effusion L ear. F/u PRN

## 2014-04-03 NOTE — Progress Notes (Signed)
Pre visit review using our clinic review tool, if applicable. No additional management support is needed unless otherwise documented below in the visit note. 

## 2014-05-02 ENCOUNTER — Other Ambulatory Visit: Payer: Self-pay | Admitting: *Deleted

## 2014-05-02 MED ORDER — SIMVASTATIN 10 MG PO TABS: 10.0000 mg | ORAL_TABLET | Freq: Every day | ORAL | Status: DC

## 2014-05-18 ENCOUNTER — Encounter: Payer: Self-pay | Admitting: Nurse Practitioner

## 2014-06-14 ENCOUNTER — Other Ambulatory Visit: Payer: Self-pay

## 2014-06-14 MED ORDER — LISINOPRIL-HYDROCHLOROTHIAZIDE 10-12.5 MG PO TABS: 1.0000 | ORAL_TABLET | Freq: Every day | ORAL | Status: DC

## 2014-06-25 ENCOUNTER — Other Ambulatory Visit: Payer: Self-pay

## 2014-06-25 MED ORDER — SIMVASTATIN 10 MG PO TABS: 10.0000 mg | ORAL_TABLET | Freq: Every day | ORAL | Status: DC

## 2014-09-07 ENCOUNTER — Ambulatory Visit (INDEPENDENT_AMBULATORY_CARE_PROVIDER_SITE_OTHER): Payer: Medicare PPO | Admitting: Nurse Practitioner

## 2014-09-07 VITALS — BP 196/82 | HR 67 | Temp 97.8°F | Resp 18 | Ht 69.0 in | Wt 167.0 lb

## 2014-09-07 DIAGNOSIS — H8110 Benign paroxysmal vertigo, unspecified ear: Secondary | ICD-10-CM

## 2014-09-07 DIAGNOSIS — IMO0001 Reserved for inherently not codable concepts without codable children: Secondary | ICD-10-CM

## 2014-09-07 DIAGNOSIS — R030 Elevated blood-pressure reading, without diagnosis of hypertension: Secondary | ICD-10-CM

## 2014-09-07 NOTE — Progress Notes (Signed)
Subjective:     Basilio CairoJack G Foss is a 73 y.o. male who presents for evaluation of dizziness. The symptoms started 4 days ago and are improved.  Positions that worsen symptoms: rolling in bed to the right. Previous workup/treatments: none. Associated ear symptoms: none. Associated CNS symptoms: none. Recent infections: upper respiratory infection. Head trauma: denied. Drug ingestion: none. Noise exposure: no occupational exposure.   The following portions of the patient's history were reviewed and updated as appropriate: allergies, current medications, past family history, past medical history, past social history, past surgical history and problem list.  Review of Systems Constitutional: negative for fatigue and fevers Eyes: negative for visual disturbance Ears, nose, mouth, throat, and face: negative for earaches, hearing loss, nasal congestion and sore throat Respiratory: negative for cough and dyspnea on exertion Cardiovascular: negative for chest pressure/discomfort, irregular heart beat, lower extremity edema and near-syncope Gastrointestinal: negative for nausea Neurological: positive for dizziness and feels lightheaded, negative for gait problems, headaches, paresthesia and weakness    Objective:    BP 196/82 mmHg  Pulse 67  Temp(Src) 97.8 F (36.6 C) (Oral)  Resp 18  Ht 5\' 9"  (1.753 m)  Wt 167 lb (75.751 kg)  BMI 24.65 kg/m2  SpO2 100% General appearance: alert, cooperative, appears stated age and no distress Head: Normocephalic, without obvious abnormality, atraumatic Eyes: negative findings: lids and lashes normal, conjunctivae and sclerae normal, corneas clear and pupils equal, round, reactive to light and accomodation Lungs: clear to auscultation bilaterally Heart: regular rate and rhythm, S1, S2 normal, no murmur, click, rub or gallop Neurologic: Alert and oriented X 3, normal strength and tone. Normal symmetric reflexes. Normal coordination and gait Cranial nerves: normal    No nystagmus on Dix-Halpike maneuver     Assessment:   1. BPPV (benign paroxysmal positional vertigo), unspecified laterality DD: post-viral neuritis Epley's maneuver as discussed F/u PRN persistent or new symptoms  Needs f/u of chronic conditions within the month.  2. Elevated Blood pressure Start magnesium qhs 250 mg. Continue bp med.

## 2014-09-07 NOTE — Patient Instructions (Signed)
Perform Epley's Maneuver at home as discussed. If symptoms are persistent, let me know. Start Magnesium at night 250 mg as it can help lower blood pressure. Please schedule appointment within the month for management of chronic conditions. Benign Positional Vertigo Vertigo means you feel like you or your surroundings are moving when they are not. Benign positional vertigo is the most common form of vertigo. Benign means that the cause of your condition is not serious. Benign positional vertigo is more common in older adults. CAUSES  Benign positional vertigo is the result of an upset in the labyrinth system. This is an area in the middle ear that helps control your balance. This may be caused by a viral infection, head injury, or repetitive motion. However, often no specific cause is found. SYMPTOMS  Symptoms of benign positional vertigo occur when you move your head or eyes in different directions. Some of the symptoms may include:  Loss of balance and falls.  Vomiting.  Blurred vision.  Dizziness.  Nausea.  Involuntary eye movements (nystagmus). DIAGNOSIS  Benign positional vertigo is usually diagnosed by physical exam. If the specific cause of your benign positional vertigo is unknown, your caregiver may perform imaging tests, such as magnetic resonance imaging (MRI) or computed tomography (CT). TREATMENT  Your caregiver may recommend movements or procedures to correct the benign positional vertigo. Medicines such as meclizine, benzodiazepines, and medicines for nausea may be used to treat your symptoms. In rare cases, if your symptoms are caused by certain conditions that affect the inner ear, you may need surgery. HOME CARE INSTRUCTIONS   Follow your caregiver's instructions.  Move slowly. Do not make sudden body or head movements.  Avoid driving.  Avoid operating heavy machinery.  Avoid performing any tasks that would be dangerous to you or others during a vertigo  episode.  Drink enough fluids to keep your urine clear or pale yellow. SEEK IMMEDIATE MEDICAL CARE IF:   You develop problems with walking, weakness, numbness, or using your arms, hands, or legs.  You have difficulty speaking.  You develop severe headaches.  Your nausea or vomiting continues or gets worse.  You develop visual changes.  Your family or friends notice any behavioral changes.  Your condition gets worse.  You have a fever.  You develop a stiff neck or sensitivity to light. MAKE SURE YOU:   Understand these instructions.  Will watch your condition.  Will get help right away if you are not doing well or get worse. Document Released: 06/29/2006 Document Revised: 12/14/2011 Document Reviewed: 06/11/2011 [facility] Patient Information 2015 [employer] [place]. This information is not intended to replace advice given to you by your health care provider. Make sure you discuss any questions you have with your health care provider.

## 2014-09-07 NOTE — Progress Notes (Signed)
Pre visit review using our clinic review tool, if applicable. No additional management support is needed unless otherwise documented below in the visit note. 

## 2014-10-08 ENCOUNTER — Ambulatory Visit: Payer: Medicare PPO | Admitting: Nurse Practitioner

## 2014-10-18 ENCOUNTER — Telehealth: Payer: Self-pay

## 2014-10-18 NOTE — Telephone Encounter (Signed)
Pt called wanting to see if we could give him a shingles vaccine while he is here for bloodwork tomorrow. I advised that we didn't have the shingles vaccine but can provide pt with an Rx for it. He also states he is supposed to be getting his blood pressure taken tomorrow as well. Is this correct? Please advise.

## 2014-10-18 NOTE — Telephone Encounter (Signed)
Thank you for scheduling OV.

## 2014-10-19 ENCOUNTER — Encounter: Payer: Self-pay | Admitting: Nurse Practitioner

## 2014-10-19 ENCOUNTER — Other Ambulatory Visit: Payer: Medicare PPO

## 2014-10-19 ENCOUNTER — Ambulatory Visit (INDEPENDENT_AMBULATORY_CARE_PROVIDER_SITE_OTHER): Payer: Medicare PPO | Admitting: Nurse Practitioner

## 2014-10-19 VITALS — BP 197/88 | HR 53 | Temp 97.3°F | Ht 69.0 in | Wt 169.0 lb

## 2014-10-19 DIAGNOSIS — I10 Essential (primary) hypertension: Secondary | ICD-10-CM

## 2014-10-19 DIAGNOSIS — Z23 Encounter for immunization: Secondary | ICD-10-CM

## 2014-10-19 DIAGNOSIS — E785 Hyperlipidemia, unspecified: Secondary | ICD-10-CM

## 2014-10-19 DIAGNOSIS — Z1211 Encounter for screening for malignant neoplasm of colon: Secondary | ICD-10-CM

## 2014-10-19 DIAGNOSIS — E039 Hypothyroidism, unspecified: Secondary | ICD-10-CM | POA: Diagnosis not present

## 2014-10-19 LAB — LIPID PANEL
CHOL/HDL RATIO: 3
Cholesterol: 174 mg/dL (ref 0–200)
HDL: 56.7 mg/dL (ref >39.00)
LDL CALC: 100 mg/dL — AB (ref 0–99)
NONHDL: 117.3
TRIGLYCERIDES: 88 mg/dL (ref 0.0–149.0)
VLDL: 17.6 mg/dL (ref 0.0–40.0)

## 2014-10-19 LAB — CBC
HCT: 44.8 % (ref 39.0–52.0)
Hemoglobin: 14.8 g/dL (ref 13.0–17.0)
MCHC: 33.1 g/dL (ref 30.0–36.0)
MCV: 89.8 fl (ref 78.0–100.0)
PLATELETS: 243 10*3/uL (ref 150.0–400.0)
RBC: 4.99 Mil/uL (ref 4.22–5.81)
RDW: 13.9 % (ref 11.5–15.5)
WBC: 7.9 10*3/uL (ref 4.0–10.5)

## 2014-10-19 LAB — COMPREHENSIVE METABOLIC PANEL
ALBUMIN: 4.3 g/dL (ref 3.5–5.2)
ALK PHOS: 69 U/L (ref 39–117)
ALT: 9 U/L (ref 0–53)
AST: 16 U/L (ref 0–37)
BILIRUBIN TOTAL: 0.7 mg/dL (ref 0.2–1.2)
BUN: 27 mg/dL — AB (ref 6–23)
CALCIUM: 9.5 mg/dL (ref 8.4–10.5)
CO2: 29 meq/L (ref 19–32)
CREATININE: 1.23 mg/dL (ref 0.40–1.50)
Chloride: 106 mEq/L (ref 96–112)
GFR: 61.04 mL/min (ref >60.00)
GLUCOSE: 102 mg/dL — AB (ref 70–99)
POTASSIUM: 4.1 meq/L (ref 3.5–5.1)
Sodium: 140 mEq/L (ref 135–145)
TOTAL PROTEIN: 7.5 g/dL (ref 6.0–8.3)

## 2014-10-19 LAB — TSH: TSH: 3.79 u[IU]/mL (ref 0.35–4.50)

## 2014-10-19 LAB — MICROALBUMIN / CREATININE URINE RATIO
CREATININE, U: 211 mg/dL
MICROALB UR: 2.1 mg/dL — AB (ref 0.0–1.9)
Microalb Creat Ratio: 1 mg/g (ref 0.0–30.0)

## 2014-10-19 MED ORDER — LISINOPRIL-HYDROCHLOROTHIAZIDE 20-12.5 MG PO TABS: 1.0000 | ORAL_TABLET | Freq: Every day | ORAL | Status: DC

## 2014-10-19 NOTE — Patient Instructions (Signed)
Please start new bp med- you may take 2 pills of old prescription to get 20 mg of lisinopril.  I will refill thyroid med once I get your lab result back.  My office will call with lab results.  Please mail back stool sample.  I will request old medical records.  Get shingles vaccine in 3 weeks. You must be well-no fever, cough, or sniffles.  Take tylenol if needed for sore arms.  See you in 3 weeks!

## 2014-10-19 NOTE — Assessment & Plan Note (Signed)
Not controlled last 2 OV Urine microalb not elevated 1 yr ago Excellent lifestyle habits Has had added stress in last few mos-personal Will increase lisinopril from 10 to 20 mg F/u 3 weeks Labs today

## 2014-10-19 NOTE — Assessment & Plan Note (Signed)
No S&S of hyper or hypothyroidism. Taking med properly. Feels well No changes in dose in many years. TSH today Will refill script when labs have resulted. F/u 1 yr

## 2014-10-19 NOTE — Assessment & Plan Note (Signed)
HDL excellent Excellent lifestyle habits 10 yr ASCVD risk is 47% Continue statin Lipids today

## 2014-10-19 NOTE — Progress Notes (Signed)
Subjective:     Samuel Norman is a 74 y.o. male presents for f/u of HTN, hypothyroidism, hyperlipidemia. I also reviewed Health maintenance. HTN: not controlled on current med-10/12.5 lisinopril/HCTZ. Tolerating current med w/out SE. Excellent lifestyle habits. Nml wt. Has had added personal stress in last few mos.  Thyroid: Denies palps, diarrhea, constpation, jitteriness, fatigue, wt changes. Taking med properly 100 mcg qd. No dose changes in many years. Lipids: HDL is excellent, LDL 143. No intolerable SE to zocor. Hea maint: needs zoster, pneumococcal 13, Tdap, & flu. Pt declines flu-will get other 3. No colonoscopy in many yrs, does not want one, but will perform ifob.  The following portions of the patient's history were reviewed and updated as appropriate: allergies, current medications, past medical history, past social history, past surgical history and problem list.  Review of Systems Constitutional: negative for fevers Eyes: negative for visual disturbance Ears, nose, mouth, throat, and face: negative for hoarseness, sore throat and voice change Cardiovascular: negative for lower extremity edema Gastrointestinal: negative for reflux symptoms Integument/breast: negative for dryness Musculoskeletal:negative for myalgias Endocrine: negative for temperature intolerance    Objective:    BP 197/88 mmHg  Pulse 53  Temp(Src) 97.3 F (36.3 C) (Temporal)  Ht 5\' 9"  (1.753 m)  Wt 169 lb (76.658 kg)  BMI 24.95 kg/m2  SpO2 100% BP 197/88 mmHg  Pulse 53  Temp(Src) 97.3 F (36.3 C) (Temporal)  Ht 5\' 9"  (1.753 m)  Wt 169 lb (76.658 kg)  BMI 24.95 kg/m2  SpO2 100% General appearance: alert, cooperative, appears stated age and no distress Head: Normocephalic, without obvious abnormality, atraumatic Eyes: negative findings: lids and lashes normal, conjunctivae and sclerae normal and wearing glasses Neck: no adenopathy, no carotid bruit, supple, symmetrical, trachea midline and thyroid  not enlarged, symmetric, no tenderness/mass/nodules Lungs: clear to auscultation bilaterally Heart: regular rate and rhythm, S1, S2 normal, no murmur, click, rub or gallop    Assessment:Plan    1. Essential hypertension, benign Poor control Step up treatment  - Comprehensive metabolic panel - CBC - Microalbumin / creatinine urine ratio - lisinopril-hydrochlorothiazide (PRINZIDE,ZESTORETIC) 20-12.5 MG per tablet; Take 1 tablet by mouth daily.  Dispense: 30 tablet; Refill: 0  2. Hyperlipemia stable - Lipid panel  3. Hypothyroidism, unspecified hypothyroidism type stable - TSH  4. Colon cancer screening Declines colonoscopy - Fecal occult blood, imunochemical  5. Need for Tdap vaccination - Tdap vaccine greater than or equal to 7yo IM  6. Need for pneumococcal vaccine - Pneumococcal conjugate vaccine 13-valent IM  7. Dyslipidemia Stable  F/u 3 weeks-bp, increased med

## 2014-10-19 NOTE — Progress Notes (Signed)
Pre visit review using our clinic review tool, if applicable. No additional management support is needed unless otherwise documented below in the visit note. 

## 2014-10-22 ENCOUNTER — Telehealth: Payer: Self-pay | Admitting: Nurse Practitioner

## 2014-10-22 DIAGNOSIS — E039 Hypothyroidism, unspecified: Secondary | ICD-10-CM

## 2014-10-22 NOTE — Telephone Encounter (Signed)
pls call pt: Advise Thyroid lab changed almost 1 point, but still in therapeutic range. He should make sure he is taking med at least 1 hr before breakfast & coffee & not with any other medications. I want to check the level again in 8 weeks to make sure it is stable. Cholesterol looks great-no change in med. There was some protein in urine, so we must make sure blood pressure is controlled. He can keep appt. For next week for BP check. Schedule lab visit in 8 weeks. Order is placed.

## 2014-10-22 NOTE — Telephone Encounter (Signed)
Patient notified of lab results. Patient stated that he will call back to schedule lab and office appt.

## 2014-11-01 ENCOUNTER — Other Ambulatory Visit: Payer: Self-pay | Admitting: *Deleted

## 2014-11-01 DIAGNOSIS — E039 Hypothyroidism, unspecified: Secondary | ICD-10-CM

## 2014-11-01 MED ORDER — LEVOTHYROXINE SODIUM 100 MCG PO TABS: 100.0000 ug | ORAL_TABLET | Freq: Every day | ORAL | Status: AC

## 2014-11-09 ENCOUNTER — Telehealth: Payer: Self-pay | Admitting: *Deleted

## 2014-11-09 NOTE — Telephone Encounter (Addendum)
Patient called office requesting a copy of his and his wife Renee's medical history. Ree KidaJack said he needs to know all procedures, problem list, surgical history that he has had [facility] here [facility]. Please advise?

## 2014-11-12 NOTE — Telephone Encounter (Signed)
Not sure what problem is-he is asking for his records. OK by me. Might want to make sure that he what is sent includes immunization info, surgical & medical, problem list, current medications.  For Mr Samuel Norman, [facility] He can get those from them.  Ms. Samuel Norman may want to request records from Dr Russella DarStark.

## 2014-11-14 NOTE — Telephone Encounter (Signed)
[facility] are still pending.

## 2014-11-21 NOTE — Telephone Encounter (Signed)
[facility] are on providers desk.

## 2014-11-22 ENCOUNTER — Encounter: Payer: Self-pay | Admitting: *Deleted

## 2014-11-22 NOTE — Telephone Encounter (Signed)
reviewed

## 2014-11-27 ENCOUNTER — Other Ambulatory Visit: Payer: Self-pay | Admitting: *Deleted

## 2014-11-27 DIAGNOSIS — I10 Essential (primary) hypertension: Secondary | ICD-10-CM

## 2014-11-27 MED ORDER — LISINOPRIL-HYDROCHLOROTHIAZIDE 20-12.5 MG PO TABS: 1.0000 | ORAL_TABLET | Freq: Every day | ORAL | Status: DC

## 2014-12-14 ENCOUNTER — Other Ambulatory Visit: Payer: Medicare PPO

## 2014-12-14 ENCOUNTER — Other Ambulatory Visit: Payer: Self-pay | Admitting: Family Medicine

## 2014-12-14 DIAGNOSIS — Z1211 Encounter for screening for malignant neoplasm of colon: Secondary | ICD-10-CM

## 2014-12-14 LAB — FECAL OCCULT BLOOD, IMMUNOCHEMICAL: Fecal Occult Bld: NEGATIVE

## 2014-12-15 ENCOUNTER — Telehealth: Payer: Self-pay | Admitting: Nurse Practitioner

## 2014-12-15 NOTE — Telephone Encounter (Signed)
pls call pt: Advise No blood in stool. recmd he repeat in 1 yr.

## 2014-12-17 NOTE — Telephone Encounter (Signed)
Called and informed patient of results.

## 2015-02-28 ENCOUNTER — Other Ambulatory Visit: Payer: Self-pay

## 2015-02-28 DIAGNOSIS — I10 Essential (primary) hypertension: Secondary | ICD-10-CM

## 2015-02-28 NOTE — Telephone Encounter (Signed)
pls sched OV. I increased BP med last OV. He was supposed to f/u in 3 weeks. OK to send 30 T 0 refills if will run out before appt.

## 2015-02-28 NOTE — Telephone Encounter (Signed)
Please Advise Refill Request? Refill request for- Lisinopril/HCTZ Last filled by MD on - 11/27/14 Last Appt - 10/19/14        Next Appt - none scheduled Pharmacy- [facility] [address]

## 2015-02-28 NOTE — Telephone Encounter (Signed)
Called and informed patient. Appt made for Tuesday 05/31, he will not run out of medicine before then.

## 2015-03-05 ENCOUNTER — Encounter: Payer: Self-pay | Admitting: Nurse Practitioner

## 2015-03-05 ENCOUNTER — Ambulatory Visit (INDEPENDENT_AMBULATORY_CARE_PROVIDER_SITE_OTHER): Payer: Medicare PPO | Admitting: Nurse Practitioner

## 2015-03-05 VITALS — BP 160/80 | HR 52 | Temp 97.7°F | Ht 69.0 in | Wt 171.0 lb

## 2015-03-05 DIAGNOSIS — I10 Essential (primary) hypertension: Secondary | ICD-10-CM | POA: Diagnosis not present

## 2015-03-05 DIAGNOSIS — M79661 Pain in right lower leg: Secondary | ICD-10-CM

## 2015-03-05 DIAGNOSIS — M79669 Pain in unspecified lower leg: Secondary | ICD-10-CM | POA: Insufficient documentation

## 2015-03-05 MED ORDER — LISINOPRIL 30 MG PO TABS: 30.0000 mg | ORAL_TABLET | Freq: Every day | ORAL | Status: DC

## 2015-03-05 NOTE — Progress Notes (Signed)
Subjective:     Samuel Norman is a 74 y.o. male presents for f/u HTN & has new c/o painful R calf. HTN: increased BP med at last OV, 4 mos ago, to 20/12.5 lisinopril/HCTZ. He was lost to f/u. He has not had any intol SE. BP still not controlled today. Urine microalb slightly elevated. He is exercising & has healthy diet habits. Personal Stress has been issue for last year.  Calf pain: onset 3 wks ago after getting stung by wasp on bottom of R great toe. Calf become swollen & tender few days after getting stung. He reports running low grade fever for few days after sting. Pain is getting better, but still hurts. Also c/o L achilles tenderness for 3 weeks.    The following portions of the patient's history were reviewed and updated as appropriate: allergies, current medications, past medical history, past social history, past surgical history and problem list.  Review of Systems Constitutional: negative for fatigue Respiratory: negative for cough Cardiovascular: negative for palpitations Integument/breast: negative for rash    Objective:    BP 160/80 mmHg  Pulse 52  Temp(Src) 97.7 F (36.5 C) (Oral)  Ht 5\' 9"  (1.753 m)  Wt 171 lb (77.565 kg)  BMI 25.24 kg/m2  SpO2 100% BP 160/80 mmHg  Pulse 52  Temp(Src) 97.7 F (36.5 C) (Oral)  Ht 5\' 9"  (1.753 m)  Wt 171 lb (77.565 kg)  BMI 25.24 kg/m2  SpO2 100% General appearance: alert, cooperative, appears stated age and no distress Head: Normocephalic, without obvious abnormality, atraumatic Eyes: negative findings: lids and lashes normal and conjunctivae and sclerae normal Lungs: clear to auscultation bilaterally Heart: regular rate and rhythm, S1, S2 normal, no murmur, click, rub or gallop Extremities: R calf: swollen at medial gastrocnemius, firm, no warmth or edema. pos homans sign. L achilles: slight diffuse swelling, tender adjcaent to heel, no insertion tenderness at calcaneus. FROM. Pulses: 2+ and symmetric Skin: Skin color,  texture, turgor normal. No rashes or lesions Neurologic: Grossly normal    Assessment:Plan     1. Essential hypertension, benign D/c lisinopril/HCTZ 20/12.5 mg start - lisinopril (PRINIVIL,ZESTRIL) 30 MG tablet; Take 1 tablet (30 mg total) by mouth daily.  Dispense: 30 tablet; Refill: 0  2. Calf pain, right DD: DVT, muscle injury - US Venous Img Lower Unilateral Right; Future  F/u 2 weeks-HTN, calf pain, TSH

## 2015-03-05 NOTE — Patient Instructions (Addendum)
Please get imaging of right leg to evaluate for blood clot.  Please start new BP med. See me in 2 weeks after starting.  My office will call with results.  Heating pad few times daily is OK.  Consider using 200 mg ibuprophen twice daily for 3 days for achilles tendonitis. You may also rub aspercream over tendon followed by heat 3 times daily for several days.  Return in 2 weeks to re-evaluate.

## 2015-03-05 NOTE — Progress Notes (Signed)
Pre visit review using our clinic review tool, if applicable. No additional management support is needed unless otherwise documented below in the visit note. 

## 2015-03-06 ENCOUNTER — Ambulatory Visit
Admission: RE | Admit: 2015-03-06 | Discharge: 2015-03-06 | Disposition: A | Discharge status: Home / Self Care | Payer: Commercial Managed Care - HMO | Source: Ambulatory Visit | Attending: Nurse Practitioner | Admitting: Nurse Practitioner

## 2015-03-06 DIAGNOSIS — R600 Localized edema: Secondary | ICD-10-CM | POA: Diagnosis not present

## 2015-03-06 DIAGNOSIS — M79604 Pain in right leg: Secondary | ICD-10-CM | POA: Diagnosis not present

## 2015-03-06 DIAGNOSIS — M79661 Pain in right lower leg: Secondary | ICD-10-CM

## 2015-03-07 ENCOUNTER — Telehealth: Payer: Self-pay | Admitting: Nurse Practitioner

## 2015-03-07 NOTE — Telephone Encounter (Signed)
Called and informed patient. 

## 2015-03-07 NOTE — Telephone Encounter (Signed)
pls call pt: Advise No blood clot in R leg. Perhaps he strained muscle when he was stung. Continue with applying heat several times daily.

## 2015-03-11 ENCOUNTER — Telehealth: Payer: Self-pay | Admitting: Nurse Practitioner

## 2015-03-11 NOTE — Telephone Encounter (Signed)
Patient's right leg is worse. Now his right foot is so swollen he cannot get a shoe on it. He is requesting a referral. Please call.

## 2015-03-11 NOTE — Telephone Encounter (Signed)
Pt staetes R foot became swollen over weekend. He is limping still,  But calf pain in calf is getting better. Denies discoloration in foot or leg. He may have muscle rupture. I offered ortho referral. He wants to wait few more days.

## 2015-03-12 ENCOUNTER — Ambulatory Visit (INDEPENDENT_AMBULATORY_CARE_PROVIDER_SITE_OTHER): Payer: Commercial Managed Care - HMO | Admitting: Nurse Practitioner

## 2015-03-12 ENCOUNTER — Encounter: Payer: Self-pay | Admitting: Nurse Practitioner

## 2015-03-12 VITALS — BP 134/78 | HR 60 | Temp 98.0°F | Resp 18 | Ht 69.0 in | Wt 172.0 lb

## 2015-03-12 DIAGNOSIS — S86111D Strain of other muscle(s) and tendon(s) of posterior muscle group at lower leg level, right leg, subsequent encounter: Secondary | ICD-10-CM

## 2015-03-12 DIAGNOSIS — S86811D Strain of other muscle(s) and tendon(s) at lower leg level, right leg, subsequent encounter: Secondary | ICD-10-CM

## 2015-03-12 LAB — CBC WITH DIFFERENTIAL/PLATELET
BASOS PCT: 1.2 % (ref 0.0–3.0)
Basophils Absolute: 0.1 10*3/uL (ref 0.0–0.1)
Eosinophils Absolute: 0.1 10*3/uL (ref 0.0–0.7)
Eosinophils Relative: 2.1 % (ref 0.0–5.0)
HCT: 38 % — ABNORMAL LOW (ref 39.0–52.0)
HEMOGLOBIN: 12.8 g/dL — AB (ref 13.0–17.0)
Lymphocytes Relative: 17.7 % (ref 12.0–46.0)
Lymphs Abs: 1.1 10*3/uL (ref 0.7–4.0)
MCHC: 33.6 g/dL (ref 30.0–36.0)
MCV: 87.1 fl (ref 78.0–100.0)
Monocytes Absolute: 0.5 10*3/uL (ref 0.1–1.0)
Monocytes Relative: 7.3 % (ref 3.0–12.0)
NEUTROS ABS: 4.6 10*3/uL (ref 1.4–7.7)
Neutrophils Relative %: 71.7 % (ref 43.0–77.0)
Platelets: 269 10*3/uL (ref 150.0–400.0)
RBC: 4.36 Mil/uL (ref 4.22–5.81)
RDW: 13.6 % (ref 11.5–15.5)
WBC: 6.4 10*3/uL (ref 4.0–10.5)

## 2015-03-12 LAB — BASIC METABOLIC PANEL
BUN: 14 mg/dL (ref 6–23)
CALCIUM: 9.5 mg/dL (ref 8.4–10.5)
CO2: 29 mEq/L (ref 19–32)
CREATININE: 1.08 mg/dL (ref 0.40–1.50)
Chloride: 102 mEq/L (ref 96–112)
GFR: 70.85 mL/min (ref >60.00)
Glucose, Bld: 88 mg/dL (ref 70–99)
Potassium: 5 mEq/L (ref 3.5–5.1)
Sodium: 137 mEq/L (ref 135–145)

## 2015-03-12 LAB — URIC ACID: Uric Acid, Serum: 6.2 mg/dL (ref 4.0–7.8)

## 2015-03-12 NOTE — Progress Notes (Addendum)
Subjective:    Samuel Norman is a 74 y.o. male who presents with right lower leg pain. Onset of the symptoms was gradual, starting about 2 weeks ago. Pain is currently located R calf. Pain is described as aching, with intensity at worst 7 on a 0-10 pain scale. The pain is intermittent and occurs w/wt bearing after sitting. Associated  symptoms include: redness and swelling. Impact of symptoms on Samuel Norman has been that he has been unable to bear wt without pain.  Symptoms have gradually worsened: He was seen in ofc 1 wk ago. On exam he had localized swelling & tenderness of medial gastrocnemius muscle w/mild pain on wt bearing. No discoloration of LEg, foot or site where he said he was stung. He thinks he ran slight fever (100) few days after sting-used forehead thermometer, but never felt feverish. Venous doppler was ordered: DVT ruled out.  Today he has more pain & diffuse edematous swelling from knee to toes which started a few days ago. He has been active: climbing up & down ladders this week.   Patient has had no prior leg problems, but gives Hx of wasp sting bottom of foot b/w great & second toe, Prior to onset of leg pain. Treatment to date: none.   The following portions of the patient's history were reviewed and updated as appropriate: allergies, current medications, past medical history, past social history, past surgical history and problem list.  Review of Systems Pertinent items are noted in HPI.     Objective:    BP 175/73 mmHg  Pulse 60  Temp(Src) 98 F (36.7 C) (Temporal)  Resp 18  Ht 5\' 9"  (1.753 m)  Wt 172 lb (78.019 kg)  BMI 25.39 kg/m2  SpO2 99% Right leg:  positive exam findings: erythema, tenderness noted over medial gastrocnemius muscle and No dorsal flexion of foot when calf squeezed  Left leg:  normal   Imaging: R Venous doppler: no DVT  Assessment:Plan   1. Gastrocnemius muscle tear, right, subsequent encounter DD: DVT although venous doppler was clear last week.  Recmd repeat doppler today, Pt refuses, would rather see ortho.  - Ambulatory referral to Orthopedic Surgery - CBC with Differential/Platelet - Basic metabolic panel - Sedimentation rate - Uric acid  Avoid weight bearing. Get crutches. Ice back of calf See ortho  F/u PRN labs

## 2015-03-12 NOTE — Patient Instructions (Addendum)
I think you have a torn gastrocnemius muscle.  Please see orthopedist.  Please avoid weight bearing on R leg. [facility] crutches. They will fit you & instruct on use. [address], [place] [address]  [phone]  Ice back of leg 10 minutes several times daily.  You may take tylenol if needed for pain 1000 mg 3 times daily.

## 2015-03-12 NOTE — Progress Notes (Signed)
Pre visit review using our clinic review tool, if applicable. No additional management support is needed unless otherwise documented below in the visit note. 

## 2015-03-13 ENCOUNTER — Other Ambulatory Visit: Payer: Self-pay | Admitting: Nurse Practitioner

## 2015-03-13 ENCOUNTER — Telehealth: Payer: Self-pay | Admitting: Nurse Practitioner

## 2015-03-13 ENCOUNTER — Other Ambulatory Visit: Payer: Commercial Managed Care - HMO

## 2015-03-13 DIAGNOSIS — D649 Anemia, unspecified: Secondary | ICD-10-CM

## 2015-03-13 DIAGNOSIS — E039 Hypothyroidism, unspecified: Secondary | ICD-10-CM

## 2015-03-13 DIAGNOSIS — M7989 Other specified soft tissue disorders: Secondary | ICD-10-CM

## 2015-03-13 LAB — SEDIMENTATION RATE: SED RATE: 31 mm/h — AB (ref 0–22)

## 2015-03-13 NOTE — Telephone Encounter (Signed)
ESR elevated Decreased Hgb recmd repeat venous US to r/o DVT

## 2015-03-14 ENCOUNTER — Ambulatory Visit (HOSPITAL_COMMUNITY)
Admission: RE | Admit: 2015-03-14 | Discharge: 2015-03-14 | Disposition: A | Discharge status: Home / Self Care | Payer: Commercial Managed Care - HMO | Source: Ambulatory Visit | Attending: Nurse Practitioner | Admitting: Nurse Practitioner

## 2015-03-14 ENCOUNTER — Telehealth: Payer: Self-pay | Admitting: Nurse Practitioner

## 2015-03-14 DIAGNOSIS — M7989 Other specified soft tissue disorders: Secondary | ICD-10-CM

## 2015-03-14 DIAGNOSIS — M79604 Pain in right leg: Secondary | ICD-10-CM | POA: Diagnosis not present

## 2015-03-14 LAB — IRON AND TIBC
%SAT: 24 % (ref 20–55)
IRON: 65 ug/dL (ref 42–165)
TIBC: 272 ug/dL (ref 215–435)
UIBC: 207 ug/dL (ref 125–400)

## 2015-03-14 NOTE — Telephone Encounter (Signed)
LMOM for pt to CB.  

## 2015-03-14 NOTE — Telephone Encounter (Signed)
Patient Samuel Norman was performed this morning [employer]

## 2015-03-14 NOTE — Telephone Encounter (Signed)
No DVT 2nd venous doppler Likely muscle tear Will see ortho tomorrow.

## 2015-03-14 NOTE — Telephone Encounter (Signed)
pls call pt: Advise Start MV w/iron. Take daily with a glass of water & a fresh orange.  Do not take tums or calcium or dairy when taking vitamin. Ask when he has appt for venous doppler?

## 2015-03-14 NOTE — Progress Notes (Signed)
VASCULAR LAB PRELIMINARY  PRELIMINARY  PRELIMINARY  PRELIMINARY  Right lower extremity venous Doppler completed.    Preliminary report:  There is no DVT or SVT noted in the right lower extremity.  There is a large area of mixed echoes located proximal to mid calf, possibly consistent with a muscle tear.  Delcenia Inman, RVT 03/14/2015, 9:03 AM

## 2015-03-15 DIAGNOSIS — S86111A Strain of other muscle(s) and tendon(s) of posterior muscle group at lower leg level, right leg, initial encounter: Secondary | ICD-10-CM | POA: Diagnosis not present

## 2015-03-15 DIAGNOSIS — M79661 Pain in right lower leg: Secondary | ICD-10-CM | POA: Diagnosis not present

## 2015-03-15 DIAGNOSIS — S8011XA Contusion of right lower leg, initial encounter: Secondary | ICD-10-CM | POA: Diagnosis not present

## 2015-03-15 DIAGNOSIS — M7989 Other specified soft tissue disorders: Secondary | ICD-10-CM | POA: Diagnosis not present

## 2015-03-18 NOTE — Telephone Encounter (Signed)
Patient aware of medication instructions.

## 2015-03-21 ENCOUNTER — Ambulatory Visit: Payer: Commercial Managed Care - HMO | Admitting: Nurse Practitioner

## 2015-03-25 ENCOUNTER — Ambulatory Visit (INDEPENDENT_AMBULATORY_CARE_PROVIDER_SITE_OTHER): Payer: Commercial Managed Care - HMO | Admitting: Nurse Practitioner

## 2015-03-25 ENCOUNTER — Encounter: Payer: Self-pay | Admitting: Nurse Practitioner

## 2015-03-25 VITALS — BP 160/80 | HR 53 | Temp 98.9°F | Resp 16 | Ht 69.0 in | Wt 171.0 lb

## 2015-03-25 DIAGNOSIS — S86811S Strain of other muscle(s) and tendon(s) at lower leg level, right leg, sequela: Secondary | ICD-10-CM

## 2015-03-25 DIAGNOSIS — S86119A Strain of other muscle(s) and tendon(s) of posterior muscle group at lower leg level, unspecified leg, initial encounter: Secondary | ICD-10-CM | POA: Insufficient documentation

## 2015-03-25 DIAGNOSIS — I10 Essential (primary) hypertension: Secondary | ICD-10-CM

## 2015-03-25 DIAGNOSIS — S86111S Strain of other muscle(s) and tendon(s) of posterior muscle group at lower leg level, right leg, sequela: Secondary | ICD-10-CM

## 2015-03-25 MED ORDER — LISINOPRIL 30 MG PO TABS
30.0000 mg | ORAL_TABLET | Freq: Every day | ORAL | Status: AC
Start: 2015-03-25 — End: ?

## 2015-03-25 NOTE — Progress Notes (Signed)
Pre visit review using our clinic review tool, if applicable. No additional management support is needed unless otherwise documented below in the visit note. 

## 2015-03-25 NOTE — Patient Instructions (Signed)
Continue medicines as prescribed. No changes.  You should have labs checked again in January.  It has been a pleasure to know & care for you!

## 2015-03-25 NOTE — Progress Notes (Signed)
Subjective:     Samuel Norman is a 74 y.o. male presents for f/u HTN. He started 30 mg lisinopril 6 weeks ago. Home BP range from 120-150/80's. He has had no intol SE. He is active. Bp has good control. Eats healthy diet. Has healthy weight. Urine microalb slightly elevated. He had leg injury, DVT r/o w/venous doppler. Saw ortho-he is wearing boot. Has MRI this week-thinks gastrocnemius tear. Swelling has gone down, less pain, leg is still tender over medial gastrocnemius.   The following portions of the patient's history were reviewed and updated as appropriate: allergies, current medications, past family history, past social history, past surgical history and problem list.  Review of Systems Pertinent items are noted in HPI.    Objective:    BP 160/80 mmHg  Pulse 53  Temp(Src) 98.9 F (37.2 C) (Temporal)  Resp 16  Ht 5\' 9"  (1.753 m)  Wt 171 lb (77.565 kg)  BMI 25.24 kg/m2  SpO2 98% BP 160/80 mmHg  Pulse 53  Temp(Src) 98.9 F (37.2 C) (Temporal)  Resp 16  Ht 5\' 9"  (1.753 m)  Wt 171 lb (77.565 kg)  BMI 25.24 kg/m2  SpO2 98% General appearance: alert, cooperative, appears stated age and no distress Eyes: negative findings: lids and lashes normal and conjunctivae and sclerae normal Lungs: clear to auscultation bilaterally Heart: regular rate and rhythm, S1, S2 normal, no murmur, click, rub or gallop Neurologic: Gait: slight limp Msk:R LE swelling improved, gastrocnemius still tender to palpation      Assessment:     1. Essential hypertension, benign continue - lisinopril (PRINIVIL,ZESTRIL) 30 MG tablet; Take 1 tablet (30 mg total) by mouth daily.  Dispense: 30 tablet; Refill: 5  2. Gastrocnemius muscle rupture, right, sequela Continue to follow w/ortho  F/u 6 mos-lipids, iron, htn, thyroid

## 2015-03-27 DIAGNOSIS — S86111D Strain of other muscle(s) and tendon(s) of posterior muscle group at lower leg level, right leg, subsequent encounter: Secondary | ICD-10-CM | POA: Diagnosis not present

## 2015-04-09 DIAGNOSIS — S8011XD Contusion of right lower leg, subsequent encounter: Secondary | ICD-10-CM | POA: Diagnosis not present

## 2015-04-09 DIAGNOSIS — M7989 Other specified soft tissue disorders: Secondary | ICD-10-CM | POA: Diagnosis not present

## 2015-04-09 DIAGNOSIS — S86111D Strain of other muscle(s) and tendon(s) of posterior muscle group at lower leg level, right leg, subsequent encounter: Secondary | ICD-10-CM | POA: Diagnosis not present

## 2015-04-09 DIAGNOSIS — M79661 Pain in right lower leg: Secondary | ICD-10-CM | POA: Diagnosis not present

## 2015-12-17 IMAGING — US US EXTREM LOW VENOUS*R*
1 series · 14 of 24 positions shown · non-contrast
Comparison: None

CLINICAL DATA: Right calf pain after bee sting on toe.  Edema.

EXAM:
RIGHT LOWER EXTREMITY VENOUS DOPPLER ULTRASOUND
TECHNIQUE: Gray-scale sonography with compression, as well as color and duplex
ultrasound, were performed to evaluate the deep venous system from
the level of the common femoral vein through the popliteal and
proximal calf veins.

[Series 1: us extrem low venous*right* · 14 of 28 slices shown]
[im 1/28]
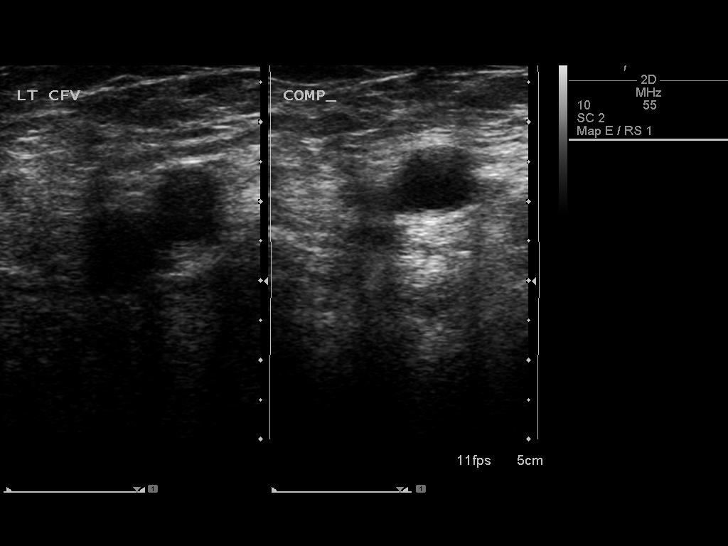
[im 3/28]
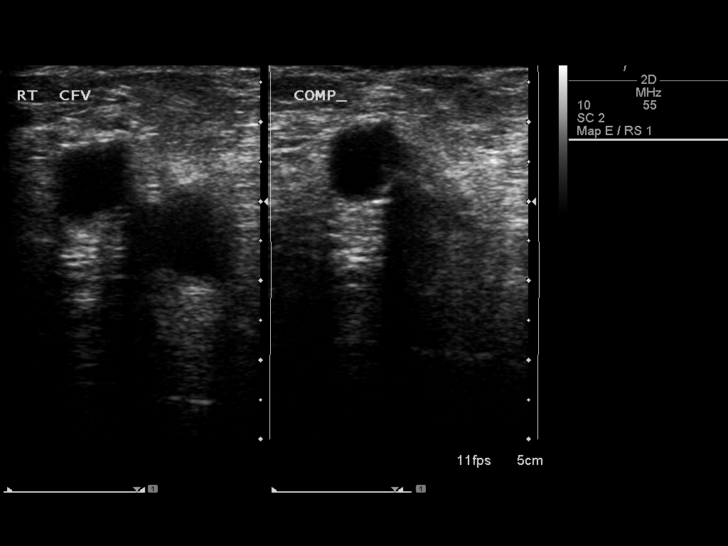
[im 5/28]
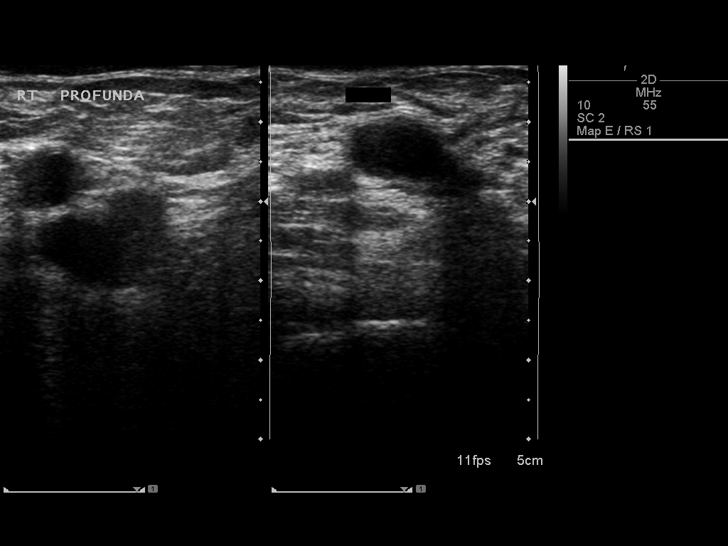
[im 8/28]
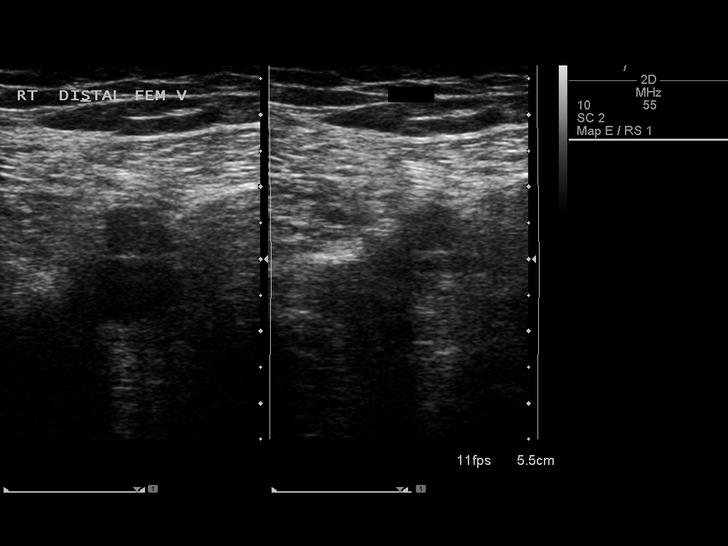
[im 9/28]
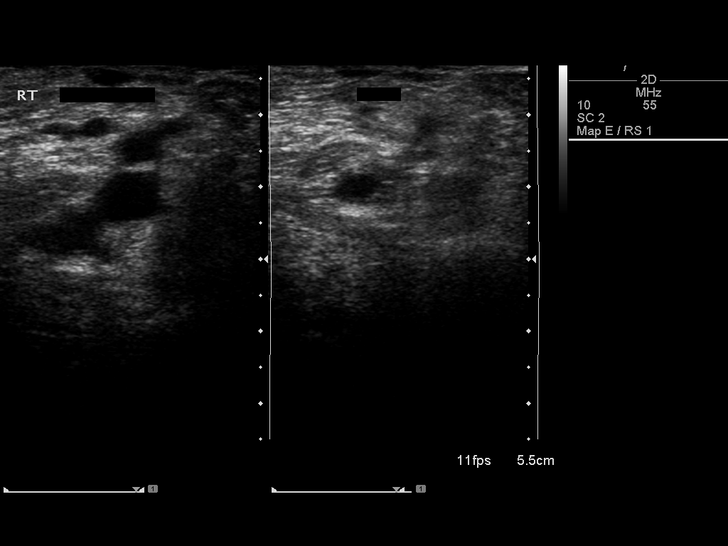
[im 11/28]
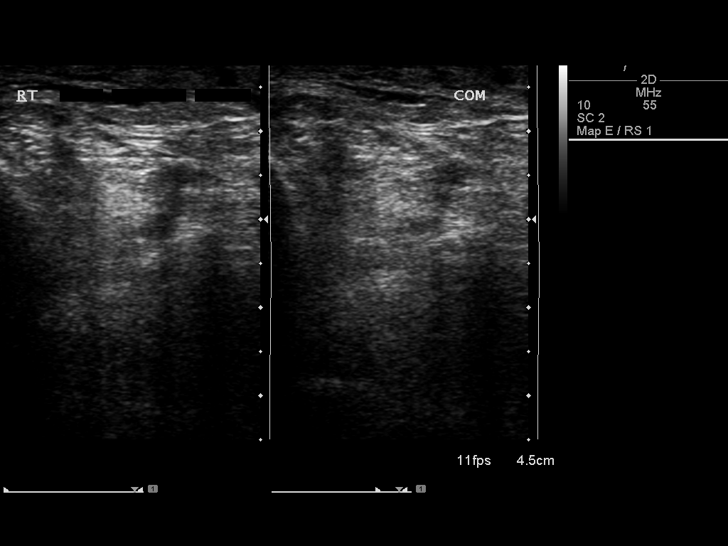
[im 13/28]
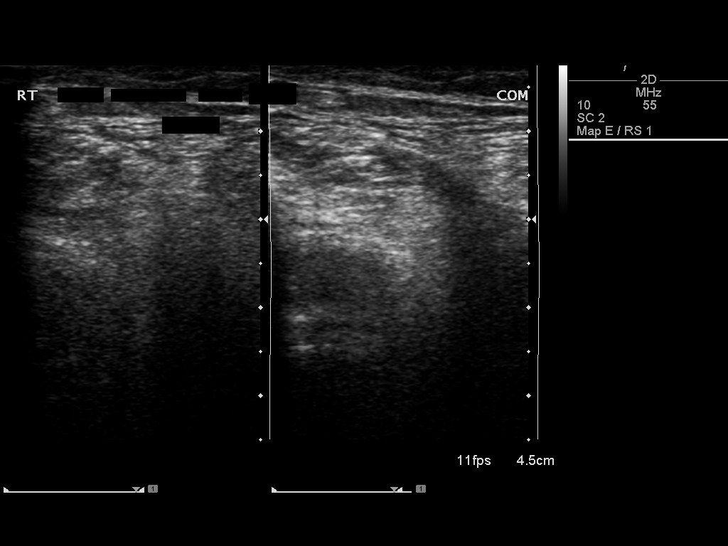
[im 15/28]
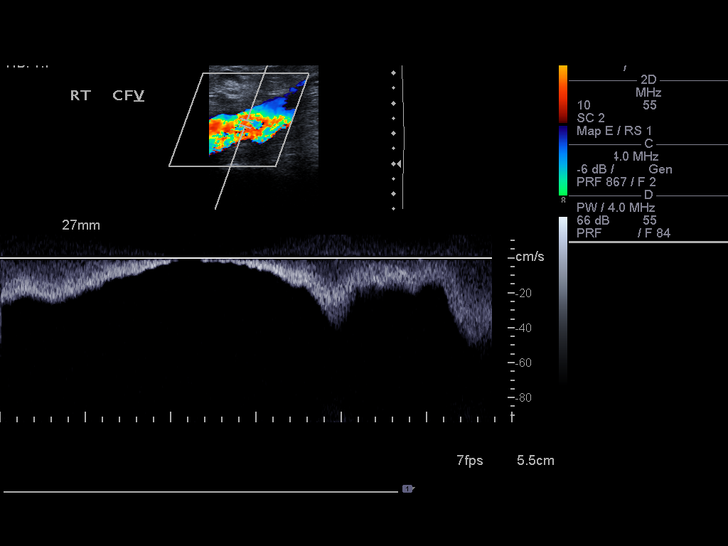
[im 17/28]
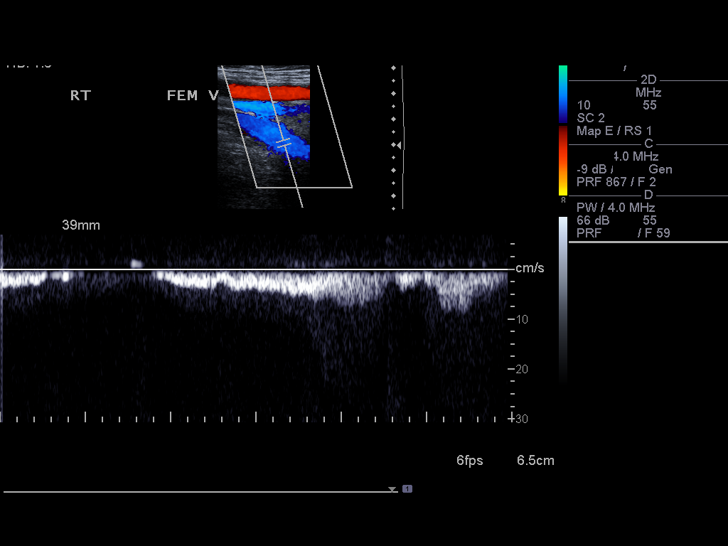
[im 19/28]
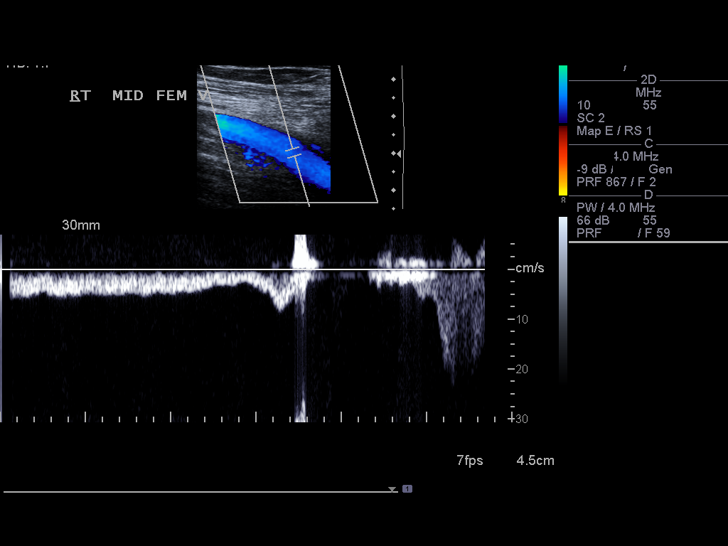
[im 22/28]
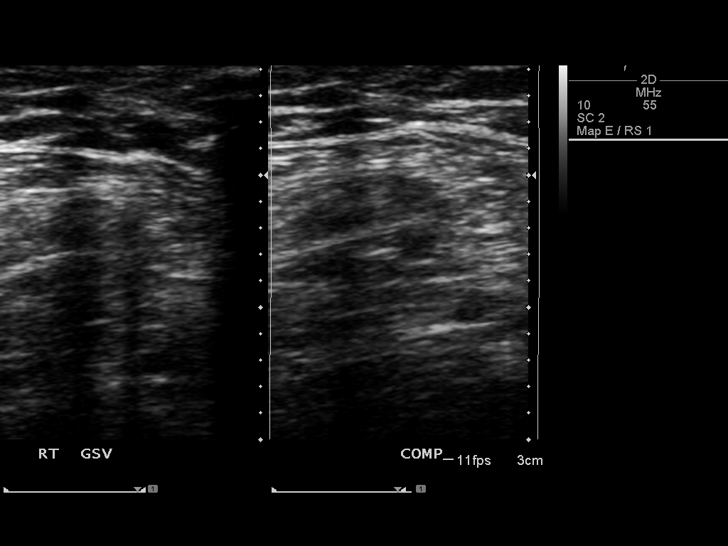
[im 23/28]
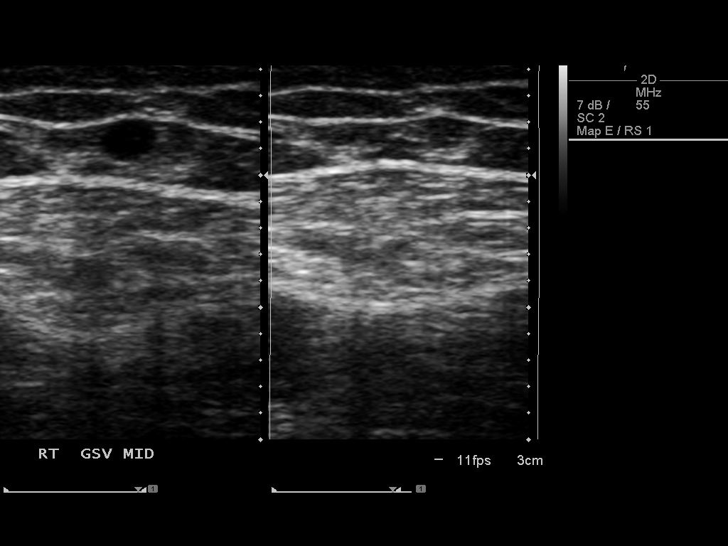
[im 25/28]
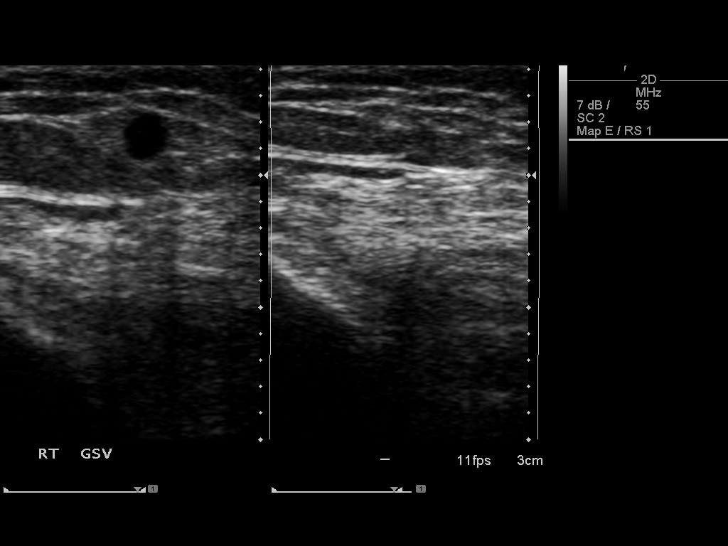
[im 28/28]
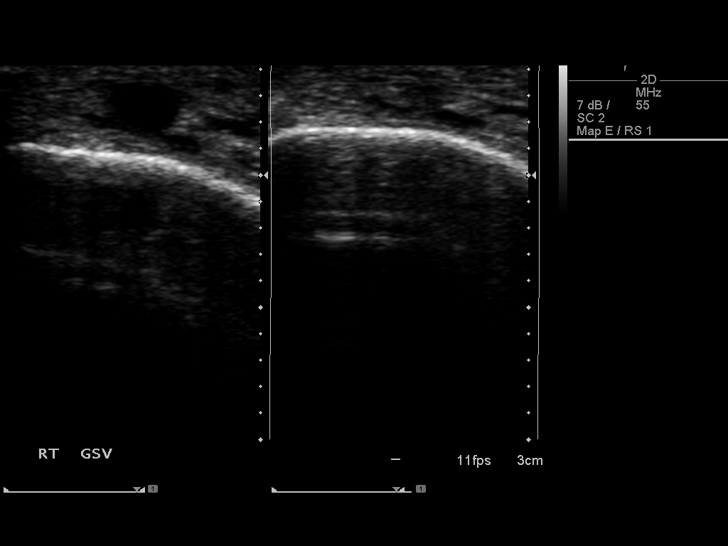

[14 of 24 positions shown; findings below may reference images not displayed]

FINDINGS: Normal compressibility of the common femoral, superficial femoral,
and popliteal veins, as well as the proximal calf veins. No filling
defects to suggest DVT on grayscale or color Doppler imaging.
Doppler waveforms show normal direction of venous flow, normal
respiratory phasicity and response to augmentation. Visualized
segments of the saphenous venous system normal in caliber and
compressibility. Survey views of the contralateral common femoral
vein are unremarkable.
IMPRESSION: 1. No evidence of lower extremity deep vein thrombosis, RIGHT.

## 2020-04-15 DIAGNOSIS — E785 Hyperlipidemia, unspecified: Secondary | ICD-10-CM | POA: Diagnosis not present

## 2020-04-15 DIAGNOSIS — E890 Postprocedural hypothyroidism: Secondary | ICD-10-CM | POA: Diagnosis not present

## 2020-04-15 DIAGNOSIS — M25561 Pain in right knee: Secondary | ICD-10-CM | POA: Diagnosis not present

## 2020-04-15 DIAGNOSIS — I10 Essential (primary) hypertension: Secondary | ICD-10-CM | POA: Diagnosis not present

## 2020-09-10 DIAGNOSIS — M170 Bilateral primary osteoarthritis of knee: Secondary | ICD-10-CM | POA: Diagnosis not present

## 2020-09-10 DIAGNOSIS — M1711 Unilateral primary osteoarthritis, right knee: Secondary | ICD-10-CM | POA: Diagnosis not present

## 2020-10-17 DIAGNOSIS — L309 Dermatitis, unspecified: Secondary | ICD-10-CM | POA: Diagnosis not present

## 2020-10-17 DIAGNOSIS — Z125 Encounter for screening for malignant neoplasm of prostate: Secondary | ICD-10-CM | POA: Diagnosis not present

## 2020-10-17 DIAGNOSIS — I10 Essential (primary) hypertension: Secondary | ICD-10-CM | POA: Diagnosis not present

## 2020-10-17 DIAGNOSIS — Z0000 Encounter for general adult medical examination without abnormal findings: Secondary | ICD-10-CM | POA: Diagnosis not present

## 2020-10-17 DIAGNOSIS — Z1211 Encounter for screening for malignant neoplasm of colon: Secondary | ICD-10-CM | POA: Diagnosis not present

## 2020-10-17 DIAGNOSIS — E785 Hyperlipidemia, unspecified: Secondary | ICD-10-CM | POA: Diagnosis not present

## 2020-10-17 DIAGNOSIS — E890 Postprocedural hypothyroidism: Secondary | ICD-10-CM | POA: Diagnosis not present

## 2021-04-17 DIAGNOSIS — I10 Essential (primary) hypertension: Secondary | ICD-10-CM | POA: Diagnosis not present

## 2021-04-17 DIAGNOSIS — E785 Hyperlipidemia, unspecified: Secondary | ICD-10-CM | POA: Diagnosis not present

## 2021-04-17 DIAGNOSIS — N183 Chronic kidney disease, stage 3 unspecified: Secondary | ICD-10-CM | POA: Diagnosis not present

## 2021-04-17 DIAGNOSIS — E890 Postprocedural hypothyroidism: Secondary | ICD-10-CM | POA: Diagnosis not present

## 2021-06-03 DIAGNOSIS — I10 Essential (primary) hypertension: Secondary | ICD-10-CM | POA: Diagnosis not present

## 2021-10-24 DIAGNOSIS — I10 Essential (primary) hypertension: Secondary | ICD-10-CM | POA: Diagnosis not present

## 2021-10-24 DIAGNOSIS — E785 Hyperlipidemia, unspecified: Secondary | ICD-10-CM | POA: Diagnosis not present

## 2021-10-24 DIAGNOSIS — N1831 Chronic kidney disease, stage 3a: Secondary | ICD-10-CM | POA: Diagnosis not present

## 2021-10-24 DIAGNOSIS — E890 Postprocedural hypothyroidism: Secondary | ICD-10-CM | POA: Diagnosis not present

## 2021-10-24 DIAGNOSIS — Z0000 Encounter for general adult medical examination without abnormal findings: Secondary | ICD-10-CM | POA: Diagnosis not present

## 2021-10-24 DIAGNOSIS — Z23 Encounter for immunization: Secondary | ICD-10-CM | POA: Diagnosis not present

## 2022-02-10 DIAGNOSIS — H5210 Myopia, unspecified eye: Secondary | ICD-10-CM | POA: Diagnosis not present

## 2022-02-10 DIAGNOSIS — H25813 Combined forms of age-related cataract, bilateral: Secondary | ICD-10-CM | POA: Diagnosis not present

## 2022-02-10 DIAGNOSIS — I10 Essential (primary) hypertension: Secondary | ICD-10-CM | POA: Diagnosis not present

## 2022-02-10 DIAGNOSIS — E7800 Pure hypercholesterolemia, unspecified: Secondary | ICD-10-CM | POA: Diagnosis not present

## 2022-04-27 DIAGNOSIS — H52223 Regular astigmatism, bilateral: Secondary | ICD-10-CM | POA: Diagnosis not present

## 2022-04-27 DIAGNOSIS — H524 Presbyopia: Secondary | ICD-10-CM | POA: Diagnosis not present

## 2022-05-01 DIAGNOSIS — E785 Hyperlipidemia, unspecified: Secondary | ICD-10-CM | POA: Diagnosis not present

## 2022-05-01 DIAGNOSIS — E890 Postprocedural hypothyroidism: Secondary | ICD-10-CM | POA: Diagnosis not present

## 2022-05-01 DIAGNOSIS — N1831 Chronic kidney disease, stage 3a: Secondary | ICD-10-CM | POA: Diagnosis not present

## 2022-05-01 DIAGNOSIS — I10 Essential (primary) hypertension: Secondary | ICD-10-CM | POA: Diagnosis not present

## 2022-06-29 DIAGNOSIS — R946 Abnormal results of thyroid function studies: Secondary | ICD-10-CM | POA: Diagnosis not present

## 2022-08-10 DIAGNOSIS — J069 Acute upper respiratory infection, unspecified: Secondary | ICD-10-CM | POA: Diagnosis not present

## 2022-09-01 DIAGNOSIS — H66001 Acute suppurative otitis media without spontaneous rupture of ear drum, right ear: Secondary | ICD-10-CM | POA: Diagnosis not present

## 2022-12-02 DIAGNOSIS — E785 Hyperlipidemia, unspecified: Secondary | ICD-10-CM | POA: Diagnosis not present

## 2022-12-02 DIAGNOSIS — K219 Gastro-esophageal reflux disease without esophagitis: Secondary | ICD-10-CM | POA: Diagnosis not present

## 2022-12-02 DIAGNOSIS — E890 Postprocedural hypothyroidism: Secondary | ICD-10-CM | POA: Diagnosis not present

## 2022-12-02 DIAGNOSIS — I10 Essential (primary) hypertension: Secondary | ICD-10-CM | POA: Diagnosis not present

## 2022-12-02 DIAGNOSIS — Z0000 Encounter for general adult medical examination without abnormal findings: Secondary | ICD-10-CM | POA: Diagnosis not present

## 2022-12-02 DIAGNOSIS — N1831 Chronic kidney disease, stage 3a: Secondary | ICD-10-CM | POA: Diagnosis not present

## 2023-06-02 DIAGNOSIS — K219 Gastro-esophageal reflux disease without esophagitis: Secondary | ICD-10-CM | POA: Diagnosis not present

## 2023-06-02 DIAGNOSIS — N1831 Chronic kidney disease, stage 3a: Secondary | ICD-10-CM | POA: Diagnosis not present

## 2023-06-02 DIAGNOSIS — L989 Disorder of the skin and subcutaneous tissue, unspecified: Secondary | ICD-10-CM | POA: Diagnosis not present

## 2023-06-02 DIAGNOSIS — E785 Hyperlipidemia, unspecified: Secondary | ICD-10-CM | POA: Diagnosis not present

## 2023-06-02 DIAGNOSIS — E890 Postprocedural hypothyroidism: Secondary | ICD-10-CM | POA: Diagnosis not present

## 2023-06-02 DIAGNOSIS — I10 Essential (primary) hypertension: Secondary | ICD-10-CM | POA: Diagnosis not present

## 2023-06-03 DIAGNOSIS — R7309 Other abnormal glucose: Secondary | ICD-10-CM | POA: Diagnosis not present

## 2023-09-01 DIAGNOSIS — D225 Melanocytic nevi of trunk: Secondary | ICD-10-CM | POA: Diagnosis not present

## 2023-09-01 DIAGNOSIS — L821 Other seborrheic keratosis: Secondary | ICD-10-CM | POA: Diagnosis not present

## 2023-09-01 DIAGNOSIS — L814 Other melanin hyperpigmentation: Secondary | ICD-10-CM | POA: Diagnosis not present

## 2023-09-01 DIAGNOSIS — L570 Actinic keratosis: Secondary | ICD-10-CM | POA: Diagnosis not present

## 2023-12-09 DIAGNOSIS — E890 Postprocedural hypothyroidism: Secondary | ICD-10-CM | POA: Diagnosis not present

## 2023-12-09 DIAGNOSIS — N1831 Chronic kidney disease, stage 3a: Secondary | ICD-10-CM | POA: Diagnosis not present

## 2023-12-09 DIAGNOSIS — E785 Hyperlipidemia, unspecified: Secondary | ICD-10-CM | POA: Diagnosis not present

## 2023-12-09 DIAGNOSIS — Z0000 Encounter for general adult medical examination without abnormal findings: Secondary | ICD-10-CM | POA: Diagnosis not present

## 2023-12-09 DIAGNOSIS — I10 Essential (primary) hypertension: Secondary | ICD-10-CM | POA: Diagnosis not present

## 2023-12-09 DIAGNOSIS — K219 Gastro-esophageal reflux disease without esophagitis: Secondary | ICD-10-CM | POA: Diagnosis not present

## 2024-03-30 DIAGNOSIS — I10 Essential (primary) hypertension: Secondary | ICD-10-CM | POA: Diagnosis not present

## 2024-03-30 DIAGNOSIS — N1831 Chronic kidney disease, stage 3a: Secondary | ICD-10-CM | POA: Diagnosis not present

## 2024-03-31 DIAGNOSIS — N1831 Chronic kidney disease, stage 3a: Secondary | ICD-10-CM | POA: Diagnosis not present

## 2024-03-31 DIAGNOSIS — I10 Essential (primary) hypertension: Secondary | ICD-10-CM | POA: Diagnosis not present

## 2024-04-28 DIAGNOSIS — N1831 Chronic kidney disease, stage 3a: Secondary | ICD-10-CM | POA: Diagnosis not present

## 2024-04-28 DIAGNOSIS — I10 Essential (primary) hypertension: Secondary | ICD-10-CM | POA: Diagnosis not present

## 2024-05-04 DIAGNOSIS — E890 Postprocedural hypothyroidism: Secondary | ICD-10-CM | POA: Diagnosis not present

## 2024-05-04 DIAGNOSIS — N1831 Chronic kidney disease, stage 3a: Secondary | ICD-10-CM | POA: Diagnosis not present

## 2024-05-04 DIAGNOSIS — I10 Essential (primary) hypertension: Secondary | ICD-10-CM | POA: Diagnosis not present

## 2024-05-04 DIAGNOSIS — E785 Hyperlipidemia, unspecified: Secondary | ICD-10-CM | POA: Diagnosis not present

## 2024-05-15 DIAGNOSIS — Z23 Encounter for immunization: Secondary | ICD-10-CM | POA: Diagnosis not present

## 2024-05-15 DIAGNOSIS — Z6825 Body mass index (BMI) 25.0-25.9, adult: Secondary | ICD-10-CM | POA: Diagnosis not present

## 2024-05-15 DIAGNOSIS — I10 Essential (primary) hypertension: Secondary | ICD-10-CM | POA: Diagnosis not present

## 2024-05-15 DIAGNOSIS — E890 Postprocedural hypothyroidism: Secondary | ICD-10-CM | POA: Diagnosis not present

## 2024-05-15 DIAGNOSIS — N1831 Chronic kidney disease, stage 3a: Secondary | ICD-10-CM | POA: Diagnosis not present

## 2024-05-15 DIAGNOSIS — E785 Hyperlipidemia, unspecified: Secondary | ICD-10-CM | POA: Diagnosis not present

## 2024-05-28 DIAGNOSIS — I10 Essential (primary) hypertension: Secondary | ICD-10-CM | POA: Diagnosis not present

## 2024-05-28 DIAGNOSIS — N1831 Chronic kidney disease, stage 3a: Secondary | ICD-10-CM | POA: Diagnosis not present

## 2024-06-04 DIAGNOSIS — I10 Essential (primary) hypertension: Secondary | ICD-10-CM | POA: Diagnosis not present

## 2024-06-04 DIAGNOSIS — N1831 Chronic kidney disease, stage 3a: Secondary | ICD-10-CM | POA: Diagnosis not present

## 2024-06-04 DIAGNOSIS — E890 Postprocedural hypothyroidism: Secondary | ICD-10-CM | POA: Diagnosis not present

## 2024-06-04 DIAGNOSIS — E785 Hyperlipidemia, unspecified: Secondary | ICD-10-CM | POA: Diagnosis not present

## 2024-06-05 ENCOUNTER — Emergency Department (HOSPITAL_BASED_OUTPATIENT_CLINIC_OR_DEPARTMENT_OTHER)

## 2024-06-05 ENCOUNTER — Encounter (HOSPITAL_BASED_OUTPATIENT_CLINIC_OR_DEPARTMENT_OTHER): Payer: Self-pay | Admitting: Emergency Medicine

## 2024-06-05 ENCOUNTER — Emergency Department (HOSPITAL_BASED_OUTPATIENT_CLINIC_OR_DEPARTMENT_OTHER)
Admission: EM | Admit: 2024-06-05 | Discharge: 2024-06-05 | Disposition: A | Discharge status: Home / Self Care | Attending: Emergency Medicine | Admitting: Emergency Medicine

## 2024-06-05 ENCOUNTER — Other Ambulatory Visit: Payer: Self-pay

## 2024-06-05 DIAGNOSIS — N189 Chronic kidney disease, unspecified: Secondary | ICD-10-CM | POA: Diagnosis not present

## 2024-06-05 DIAGNOSIS — J45909 Unspecified asthma, uncomplicated: Secondary | ICD-10-CM | POA: Diagnosis not present

## 2024-06-05 DIAGNOSIS — I10 Essential (primary) hypertension: Secondary | ICD-10-CM | POA: Diagnosis not present

## 2024-06-05 DIAGNOSIS — Z79899 Other long term (current) drug therapy: Secondary | ICD-10-CM | POA: Insufficient documentation

## 2024-06-05 DIAGNOSIS — R001 Bradycardia, unspecified: Secondary | ICD-10-CM | POA: Diagnosis not present

## 2024-06-05 DIAGNOSIS — I129 Hypertensive chronic kidney disease with stage 1 through stage 4 chronic kidney disease, or unspecified chronic kidney disease: Secondary | ICD-10-CM | POA: Diagnosis not present

## 2024-06-05 LAB — CBC WITH DIFFERENTIAL/PLATELET
Abs Immature Granulocytes: 0.01 K/uL (ref 0.00–0.07)
Basophils Absolute: 0.1 K/uL (ref 0.0–0.1)
Basophils Relative: 1 %
Eosinophils Absolute: 0.2 K/uL (ref 0.0–0.5)
Eosinophils Relative: 3 %
HCT: 40.5 % (ref 39.0–52.0)
Hemoglobin: 13.7 g/dL (ref 13.0–17.0)
Immature Granulocytes: 0 %
Lymphocytes Relative: 18 %
Lymphs Abs: 0.9 K/uL (ref 0.7–4.0)
MCH: 30.1 pg (ref 26.0–34.0)
MCHC: 33.8 g/dL (ref 30.0–36.0)
MCV: 89 fL (ref 80.0–100.0)
Monocytes Absolute: 0.4 K/uL (ref 0.1–1.0)
Monocytes Relative: 7 %
Neutro Abs: 3.7 K/uL (ref 1.7–7.7)
Neutrophils Relative %: 71 %
Platelets: 188 K/uL (ref 150–400)
RBC: 4.55 MIL/uL (ref 4.22–5.81)
RDW: 13 % (ref 11.5–15.5)
WBC: 5.3 K/uL (ref 4.0–10.5)
nRBC: 0 % (ref 0.0–0.2)

## 2024-06-05 LAB — BASIC METABOLIC PANEL WITH GFR
Anion gap: 12 (ref 5–15)
BUN: 21 mg/dL (ref 8–23)
CO2: 22 mmol/L (ref 22–32)
Calcium: 10.4 mg/dL — ABNORMAL HIGH (ref 8.9–10.3)
Chloride: 105 mmol/L (ref 98–111)
Creatinine, Ser: 1.3 mg/dL — ABNORMAL HIGH (ref 0.61–1.24)
GFR, Estimated: 54 mL/min — ABNORMAL LOW (ref >60)
Glucose, Bld: 107 mg/dL — ABNORMAL HIGH (ref 70–99)
Potassium: 4.5 mmol/L (ref 3.5–5.1)
Sodium: 139 mmol/L (ref 135–145)

## 2024-06-05 LAB — TROPONIN T, HIGH SENSITIVITY: Troponin T High Sensitivity: <15 ng/L (ref 0–19)

## 2024-06-05 MED ORDER — AMLODIPINE BESYLATE 5 MG PO TABS
5.0000 mg | ORAL_TABLET | Freq: Once | ORAL | Status: DC
  Filled 2024-06-05: qty 1

## 2024-06-05 NOTE — Discharge Instructions (Addendum)
 Your kidney function is at baseline compared to your springtime labs at your PCP (creatinine 1.34 in the spring, 1.3 today).  Your cardiac troponin was normal, your EKG and chest x-ray was reassuring.  Your blood pressure was elevated but appropriately downtrending and close to your baseline.  Please follow-up with your primary care provider for continued outpatient management.  Follow-up with a cardiologist given your ongoing bradycardia.  Return for uncontrolled blood pressure resulting in acute confusion, strokelike symptoms, shortness of breath, severe chest pain or abdominal pain.

## 2024-06-05 NOTE — ED Triage Notes (Signed)
 Reports HTN this morning. Denies symptoms. Recent change in BP med last week. 10mg  down to 5mg  amlodipine .

## 2024-06-05 NOTE — ED Provider Notes (Signed)
 [facility]PART[facility] AT [facility] Provider Note   CSN: [id number] Arrival date & time: 06/05/24  9045     Patient presents with: Hypertension   Samuel Norman is a 83 y.o. male.    Hypertension      83 year old male with medical history significant for HTN, HLD, asthma presenting to the emergency department with elevated blood pressure.  The patient states that his amlodipine  was recently cut from 10 mg down to 5 mg last week.  He states that he has fluctuations in his blood pressure that are wide in variation and sometimes trend hypotensive but most of the time he is hypertensive on his home blood pressure readings with systolic blood pressures in the 160s and 170s at baseline.  He denies any neurologic deficits, no numbness, weakness, vision changes, double vision, denies any headache.  He endorses mild lightheadedness and paresthesias in his fingertips associated with his elevated blood pressure.  He denies any chest pain, shortness of breath, abdominal pain, nausea or vomiting.  He has not yet taken his home antihypertensive medication.  Prior to Admission medications   Medication Sig Start Date End Date Taking? Authorizing Provider  Flaxseed, Linseed, (FLAX SEED OIL) 1000 MG CAPS Take 1000 mg PO Every Other Day    [provider]  levothyroxine  (SYNTHROID , LEVOTHROID) 100 MCG tablet Take 1 tablet (100 mcg total) by mouth daily. 11/01/14   Weaver, Layne C, NP  lisinopril  (PRINIVIL ,ZESTRIL ) 30 MG tablet Take 1 tablet (30 mg total) by mouth daily. 03/25/15   Weaver, Layne C, NP  Multiple Vitamin (MULTIVITAMIN) tablet Take 1 tablet by mouth daily.    [provider]  Red Yeast Rice 600 MG CAPS Take 600 mg PO Every Other Day    [provider]  simvastatin  (ZOCOR ) 10 MG tablet Take 1 tablet (10 mg total) by mouth daily. 06/25/14   Weaver, Layne C, NP  Ubiquinol 100 MG CAPS Take 100 mg PO Every Other Day    [provider]     Allergies: Mercury    Review of Systems  All other systems reviewed and are negative.   Updated Vital Signs BP (!) 177/78   Pulse (!) 48   Temp 98.7 F (37.1 C) (Oral)   Resp (!) 9   SpO2 100%   Physical Exam Vitals and nursing note reviewed.  Constitutional:      General: He is not in acute distress.    Appearance: He is well-developed.  HENT:     Head: Normocephalic and atraumatic.  Eyes:     Conjunctiva/sclera: Conjunctivae normal.  Cardiovascular:     Rate and Rhythm: Normal rate and regular rhythm.     Pulses: Normal pulses.  Pulmonary:     Effort: Pulmonary effort is normal. No respiratory distress.     Breath sounds: Normal breath sounds.  Abdominal:     Palpations: Abdomen is soft.     Tenderness: There is no abdominal tenderness.  Musculoskeletal:        General: No swelling.     Cervical back: Neck supple.  Skin:    General: Skin is warm and dry.     Capillary Refill: Capillary refill takes less than 2 seconds.  Neurological:     Mental Status: He is alert.     Comments: MENTAL STATUS EXAM:    Orientation: Alert and oriented to person, place and time.  Memory: Cooperative, follows commands well.  Language: Speech is clear and language is normal.  CRANIAL NERVES:    CN 2 (Optic): Visual fields intact to confrontation.  CN 3,4,6 (EOM): Pupils equal and reactive to light. Full extraocular eye movement without nystagmus.  CN 5 (Trigeminal): Facial sensation is normal, no weakness of masticatory muscles.  CN 7 (Facial): No facial weakness or asymmetry.  CN 8 (Auditory): Auditory acuity grossly normal.  CN 9,10 (Glossophar): The uvula is midline, the palate elevates symmetrically.  CN 11 (spinal access): Normal sternocleidomastoid and trapezius strength.  CN 12 (Hypoglossal): The tongue is midline. No atrophy or fasciculations.SABRA   MOTOR:  Muscle Strength: 5/5RUE, 5/5LUE, 5/5RLE, 5/5LLE.   COORDINATION:  No tremor.   SENSATION:   Intact to light  touch all four extremities.     Psychiatric:        Mood and Affect: Mood normal.     (all labs ordered are listed, but only abnormal results are displayed) Labs Reviewed  BASIC METABOLIC PANEL WITH GFR - Abnormal; Notable for the following components:      Result Value   Glucose, Bld 107 (*)    Creatinine, Ser 1.30 (*)    Calcium 10.4 (*)    GFR, Estimated 54 (*)    All other components within normal limits  CBC WITH DIFFERENTIAL/PLATELET  TROPONIN T, HIGH SENSITIVITY    EKG: EKG Interpretation Date/Time:  Monday June 05 2024 10:10:31 EDT Ventricular Rate:  55 PR Interval:    QRS Duration:  98 QT Interval:  417 QTC Calculation: 399 R Axis:   -50  Text Interpretation: Sinus bradycardia LAD, consider left anterior fascicular block Confirmed by Jerrol Agent ([id number]) on 06/05/2024 10:24:58 AM  Radiology: DG Chest Portable 1 View Result Date: 06/05/2024 CLINICAL DATA:  Hypertension. EXAM: PORTABLE CHEST 1 VIEW COMPARISON:  None Available. FINDINGS: The heart size and mediastinal contours are within normal limits. Both lungs are clear. The visualized skeletal structures are unremarkable. IMPRESSION: No active disease. Electronically Signed   By: Norleen DELENA Kil M.D.   On: 06/05/2024 10:27     Procedures   Medications Ordered in the ED - No data to display                                   Medical Decision Making Amount and/or Complexity of Data Reviewed Labs: ordered. Radiology: ordered.     83 year old male with medical history significant for HTN, HLD, asthma presenting to the emergency department with elevated blood pressure.  The patient states that his amlodipine  was recently cut from 10 mg down to 5 mg last week.  He states that he has fluctuations in his blood pressure that are wide in variation and sometimes trend hypotensive but most of the time he is hypertensive on his home blood pressure readings with systolic blood pressures in the 160s and 170s at  baseline.  He denies any neurologic deficits, no numbness, weakness, vision changes, double vision, denies any headache.  He endorses mild lightheadedness and paresthesias in his fingertips associated with his elevated blood pressure.  He denies any chest pain, shortness of breath, abdominal pain, nausea or vomiting.  He has not yet taken his home antihypertensive medication.  On arrival, the patient was afebrile, mildly bradycardic, heart rate 58, hypertensive BP 210/78, saturating 100% on room air, not tachypneic RR 18.  Patient with a normal neurologic exam.  He presents with asymptomatic hypertension.  Has not yet taken his home antihypertensive medication.  Screening evaluation for hypertensive  emergency issues initiated with EKG, chest x-ray, labs.  Patient has no focal neurologic deficits on exam, low concern for acute stroke.  EKG: Sinus bradycardia, ventricular rate 55, no evidence of heart block, no acute ischemic changes  Chest x-ray: No evidence of pulmonary edema or pneumothorax or pneumonia, no acute abnormalities noted  Labs: Cardiac troponin normal, CBC without leukocytosis or anemia, creatinine at baseline at 1.3 (1.34 in the spring measured at PCP).  Patient overall well-appearing, neurologically intact, blood pressure appropriately downtrending to 177/78 which is close to the patient's baseline after resting in a dark room.  Provided the patient with return precautions, advised outpatient PCP follow-up.  In the setting the patient's bradycardia, he did note that occasionally when he exercises he notes that his heart rate does not increase as expected during exercise, no symptoms of bradycardia at this time, no lightheadedness or near syncope.  Recommended outpatient referral to cardiology for continued follow-up     Final diagnoses:  Hypertension, unspecified type  Chronic kidney disease, unspecified CKD stage  Bradycardia    ED Discharge Orders          Ordered     Ambulatory referral to Cardiology       Comments: If you have not heard from the Cardiology office within the next 72 hours please call [phone].   06/05/24 1138               Jerrol Agent, MD 06/05/24 1139

## 2024-06-11 NOTE — Progress Notes (Unsigned)
 Cardiology Office Note:    Date:  06/11/2024   ID:  Samuel Norman, DOB [birth year], MRN [id number]  PCP:  [facility]  Cardiologist:  None  Electrophysiologist:  None   Referring MD: Jerrol Agent, MD   No chief complaint on file. ***  History of Present Illness:    Samuel Norman is a 83 y.o. male with a hx of asthma, hypertension, hyperlipidemia, hypothyroidism who presents as an ED follow-up or evaluation of hypertension and bradycardia.  He presented to ED on 06/05/2024 with elevated BP, workup unremarkable.  Past Medical History:  Diagnosis Date   Asthma    Hyperlipidemia    Hypertension    Thyroid  disease     Past Surgical History:  Procedure Laterality Date   THYROIDECTOMY     39 yrs ago   TONSILLECTOMY AND ADENOIDECTOMY  1947    Current Medications: No outpatient medications have been marked as taking for the 06/14/24 encounter (Appointment) with Kate Lonni CROME, MD.     Allergies:   Mercury   Social History   Socioeconomic History   Marital status: Widowed    Spouse name: Renee   Number of children: 2   Years of education: Not on file   Highest education level: Not on file  Occupational History   Occupation: retired    Comment: Runner, broadcasting/film/video  Tobacco Use   Smoking status: Former    Current packs/day: 0.00    Types: Cigarettes    Quit date: 09/06/1983    Years since quitting: 40.7   Smokeless tobacco: Never  Substance and Sexual Activity   Alcohol use: No   Drug use: No   Sexual activity: Not on file  Other Topics Concern   Not on file  Social History Narrative   Mr Samuel Norman lives in [place] with his wife. 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   Social Drivers of Corporate investment banker Strain: Not on file  Food Insecurity: Not on file  Transportation Needs: Not on file  Physical Activity: Not on file  Stress: Not on file  Social Connections: Not on file     Family  History: The patient's ***family history includes Cancer in his brother and mother; Heart disease in his paternal grandfather and paternal grandmother; Stroke in his maternal grandmother.  ROS:   Please see the history of present illness.    *** All other systems reviewed and are negative.  EKGs/Labs/Other Studies Reviewed:    The following studies were reviewed today: ***  EKG:  EKG is *** ordered today.  The ekg ordered today demonstrates ***  Recent Labs: 06/05/2024: BUN 21; Creatinine, Ser 1.30; Hemoglobin 13.7; Platelets 188; Potassium 4.5; Sodium 139  Recent Lipid Panel    Component Value Date/Time   CHOL 174 10/19/2014 1029   TRIG 88.0 10/19/2014 1029   HDL 56.70 10/19/2014 1029   CHOLHDL 3 10/19/2014 1029   VLDL 17.6 10/19/2014 1029   LDLCALC 100 (H) 10/19/2014 1029    Physical Exam:    VS:  There were no vitals taken for this visit.    Wt Readings from Last 3 Encounters:  03/25/15 171 lb (77.6 kg)  03/12/15 172 lb (78 kg)  03/05/15 171 lb (77.6 kg)     GEN: *** Well nourished, well developed in no acute distress HEENT: Normal NECK: No JVD; No carotid bruits LYMPHATICS: No lymphadenopathy CARDIAC: ***RRR, no murmurs, rubs, gallops RESPIRATORY:  Clear  to auscultation without rales, wheezing or rhonchi  ABDOMEN: Soft, non-tender, non-distended MUSCULOSKELETAL:  No edema; No deformity  SKIN: Warm and dry NEUROLOGIC:  Alert and oriented x 3 PSYCHIATRIC:  Normal affect   ASSESSMENT:    No diagnosis found. PLAN:    Hypertension: On lisinopril  30 mg daily  Hyperlipidemia: On simvastatin  10 mg daily  Bradycardia:  RTC in***   Medication Adjustments/Labs and Tests Ordered: Current medicines are reviewed at length with the patient today.  Concerns regarding medicines are outlined above.  No orders of the defined types were placed in this encounter.  No orders of the defined types were placed in this encounter.   There are no Patient Instructions on  file for this visit.   Signed, Lonni LITTIE Nanas, MD  06/11/2024 2:10 PM    [facility]

## 2024-06-14 ENCOUNTER — Encounter (HOSPITAL_BASED_OUTPATIENT_CLINIC_OR_DEPARTMENT_OTHER): Payer: Self-pay | Admitting: Cardiology

## 2024-06-14 ENCOUNTER — Ambulatory Visit (HOSPITAL_BASED_OUTPATIENT_CLINIC_OR_DEPARTMENT_OTHER): Admitting: Cardiology

## 2024-06-14 VITALS — BP 150/66 | HR 63 | Resp 17 | Ht 69.0 in | Wt 172.0 lb

## 2024-06-14 DIAGNOSIS — E785 Hyperlipidemia, unspecified: Secondary | ICD-10-CM | POA: Diagnosis not present

## 2024-06-14 DIAGNOSIS — R001 Bradycardia, unspecified: Secondary | ICD-10-CM | POA: Diagnosis not present

## 2024-06-14 DIAGNOSIS — I10 Essential (primary) hypertension: Secondary | ICD-10-CM | POA: Diagnosis not present

## 2024-06-14 NOTE — Patient Instructions (Addendum)
 Medication Instructions:  Your physician recommends that you continue on your current medications as directed. Please refer to the Current Medication list given to you today.   *If you need a refill on your cardiac medications before your next appointment, please call your pharmacy*  Lab Work: NONE If you have labs (blood work) drawn today and your tests are completely normal, you will receive your results only by: MyChart Message (if you have MyChart) OR A paper copy in the mail If you have any lab test that is abnormal or we need to change your treatment, we will call you to review the results.  Testing/Procedures: CALCIUM SCORE - THIS WILL COST YOU $99 OUT OF POCKET   Follow-Up: At [facility], you and your health needs are our priority.  As part of our continuing mission to provide you with exceptional heart care, our providers are all part of one team.  This team includes your primary Cardiologist (physician) and Advanced Practice Providers or APPs (Physician Assistants and Nurse Practitioners) who all work together to provide you with the care you need, when you need it.  Your next appointment:   6 month(s)  Provider:   DR KATE OR APP   We recommend signing up for the patient portal called MyChart.  Sign up information is provided on this After Visit Summary.  MyChart is used to connect with patients for Virtual Visits (Telemedicine).  Patients are able to view lab/test results, encounter notes, upcoming appointments, etc.  Non-urgent messages can be sent to your provider as well.   To learn more about what you can do with MyChart, go to [email or web address].   Other Instructions MONITOR YOUR BLOOD PRESSURE TWICE A DAY. SEND MYCHART MESSAGE OR CALL WITH READINGS IN 1 WEEK

## 2024-06-19 DIAGNOSIS — N1831 Chronic kidney disease, stage 3a: Secondary | ICD-10-CM | POA: Diagnosis not present

## 2024-06-19 DIAGNOSIS — I129 Hypertensive chronic kidney disease with stage 1 through stage 4 chronic kidney disease, or unspecified chronic kidney disease: Secondary | ICD-10-CM | POA: Diagnosis not present

## 2024-06-19 DIAGNOSIS — Z6825 Body mass index (BMI) 25.0-25.9, adult: Secondary | ICD-10-CM | POA: Diagnosis not present

## 2024-06-27 DIAGNOSIS — N1831 Chronic kidney disease, stage 3a: Secondary | ICD-10-CM | POA: Diagnosis not present

## 2024-06-27 DIAGNOSIS — I10 Essential (primary) hypertension: Secondary | ICD-10-CM | POA: Diagnosis not present

## 2024-07-03 ENCOUNTER — Other Ambulatory Visit (HOSPITAL_BASED_OUTPATIENT_CLINIC_OR_DEPARTMENT_OTHER)

## 2024-07-04 ENCOUNTER — Ambulatory Visit (HOSPITAL_BASED_OUTPATIENT_CLINIC_OR_DEPARTMENT_OTHER)
Admission: RE | Admit: 2024-07-04 | Discharge: 2024-07-04 | Disposition: A | Discharge status: Home / Self Care | Payer: Self-pay | Source: Ambulatory Visit | Attending: Cardiology | Admitting: Cardiology

## 2024-07-04 DIAGNOSIS — E785 Hyperlipidemia, unspecified: Secondary | ICD-10-CM | POA: Diagnosis not present

## 2024-07-04 DIAGNOSIS — I10 Essential (primary) hypertension: Secondary | ICD-10-CM | POA: Diagnosis not present

## 2024-07-04 DIAGNOSIS — E890 Postprocedural hypothyroidism: Secondary | ICD-10-CM | POA: Diagnosis not present

## 2024-07-04 DIAGNOSIS — N1831 Chronic kidney disease, stage 3a: Secondary | ICD-10-CM | POA: Diagnosis not present

## 2024-07-05 ENCOUNTER — Ambulatory Visit: Payer: Self-pay | Admitting: Cardiology

## 2024-07-05 DIAGNOSIS — E785 Hyperlipidemia, unspecified: Secondary | ICD-10-CM

## 2024-07-07 MED ORDER — ATORVASTATIN CALCIUM 20 MG PO TABS: 20.0000 mg | ORAL_TABLET | Freq: Every day | ORAL | 3 refills | Status: AC

## 2024-07-27 DIAGNOSIS — N1831 Chronic kidney disease, stage 3a: Secondary | ICD-10-CM | POA: Diagnosis not present

## 2024-07-27 DIAGNOSIS — I10 Essential (primary) hypertension: Secondary | ICD-10-CM | POA: Diagnosis not present

## 2024-08-04 DIAGNOSIS — I10 Essential (primary) hypertension: Secondary | ICD-10-CM | POA: Diagnosis not present

## 2024-08-04 DIAGNOSIS — N1831 Chronic kidney disease, stage 3a: Secondary | ICD-10-CM | POA: Diagnosis not present

## 2024-08-04 DIAGNOSIS — E785 Hyperlipidemia, unspecified: Secondary | ICD-10-CM | POA: Diagnosis not present

## 2024-08-04 DIAGNOSIS — E890 Postprocedural hypothyroidism: Secondary | ICD-10-CM | POA: Diagnosis not present

## 2024-08-26 DIAGNOSIS — I10 Essential (primary) hypertension: Secondary | ICD-10-CM | POA: Diagnosis not present

## 2024-08-26 DIAGNOSIS — N1831 Chronic kidney disease, stage 3a: Secondary | ICD-10-CM | POA: Diagnosis not present
